# Patient Record
Sex: Male | Born: 1990 | Race: Black or African American | Hispanic: No | Marital: Single | Smoking: Current every day smoker
Health system: Southern US, Community
[De-identification: ages and names within clinical notes are randomized; demographics above are authoritative.]

## PROBLEM LIST (undated history)

## (undated) DIAGNOSIS — F32A Depression, unspecified: Secondary | ICD-10-CM

## (undated) DIAGNOSIS — F329 Major depressive disorder, single episode, unspecified: Secondary | ICD-10-CM

## (undated) DIAGNOSIS — M419 Scoliosis, unspecified: Secondary | ICD-10-CM

## (undated) DIAGNOSIS — F209 Schizophrenia, unspecified: Secondary | ICD-10-CM

## (undated) HISTORY — PX: BACK SURGERY: SHX140

---

## 2010-08-11 ENCOUNTER — Emergency Department (HOSPITAL_COMMUNITY): Admission: EM | Admit: 2010-08-11 | Discharge: 2010-08-11 | Payer: Self-pay | Admitting: Emergency Medicine

## 2014-07-18 ENCOUNTER — Emergency Department (HOSPITAL_COMMUNITY): Payer: Self-pay

## 2014-07-18 ENCOUNTER — Encounter (HOSPITAL_COMMUNITY): Payer: Self-pay | Admitting: Emergency Medicine

## 2014-07-18 ENCOUNTER — Inpatient Hospital Stay (HOSPITAL_COMMUNITY)
Admission: EM | Admit: 2014-07-18 | Discharge: 2014-07-23 | DRG: 918 | Disposition: A | Discharge status: Other Acute Inpatient Hospital | Payer: Self-pay | Attending: Family Medicine | Admitting: Family Medicine

## 2014-07-18 DIAGNOSIS — R9431 Abnormal electrocardiogram [ECG] [EKG]: Secondary | ICD-10-CM | POA: Diagnosis present

## 2014-07-18 DIAGNOSIS — G2579 Other drug induced movement disorders: Secondary | ICD-10-CM | POA: Diagnosis present

## 2014-07-18 DIAGNOSIS — T43022A Poisoning by tetracyclic antidepressants, intentional self-harm, initial encounter: Secondary | ICD-10-CM | POA: Diagnosis present

## 2014-07-18 DIAGNOSIS — H5500 Unspecified nystagmus: Secondary | ICD-10-CM | POA: Diagnosis present

## 2014-07-18 DIAGNOSIS — Z8659 Personal history of other mental and behavioral disorders: Secondary | ICD-10-CM

## 2014-07-18 DIAGNOSIS — Z681 Body mass index (BMI) 19 or less, adult: Secondary | ICD-10-CM

## 2014-07-18 DIAGNOSIS — Y92009 Unspecified place in unspecified non-institutional (private) residence as the place of occurrence of the external cause: Secondary | ICD-10-CM

## 2014-07-18 DIAGNOSIS — T43212A Poisoning by selective serotonin and norepinephrine reuptake inhibitors, intentional self-harm, initial encounter: Secondary | ICD-10-CM | POA: Diagnosis present

## 2014-07-18 DIAGNOSIS — R4182 Altered mental status, unspecified: Secondary | ICD-10-CM

## 2014-07-18 DIAGNOSIS — T443X1A Poisoning by other parasympatholytics [anticholinergics and antimuscarinics] and spasmolytics, accidental (unintentional), initial encounter: Secondary | ICD-10-CM | POA: Diagnosis present

## 2014-07-18 DIAGNOSIS — F32A Depression, unspecified: Secondary | ICD-10-CM | POA: Diagnosis present

## 2014-07-18 DIAGNOSIS — M6282 Rhabdomyolysis: Secondary | ICD-10-CM | POA: Diagnosis present

## 2014-07-18 DIAGNOSIS — R451 Restlessness and agitation: Secondary | ICD-10-CM | POA: Diagnosis present

## 2014-07-18 DIAGNOSIS — T50904A Poisoning by unspecified drugs, medicaments and biological substances, undetermined, initial encounter: Secondary | ICD-10-CM

## 2014-07-18 DIAGNOSIS — E162 Hypoglycemia, unspecified: Secondary | ICD-10-CM | POA: Diagnosis present

## 2014-07-18 DIAGNOSIS — D72829 Elevated white blood cell count, unspecified: Secondary | ICD-10-CM | POA: Diagnosis present

## 2014-07-18 DIAGNOSIS — M25519 Pain in unspecified shoulder: Secondary | ICD-10-CM

## 2014-07-18 DIAGNOSIS — T443X4A Poisoning by other parasympatholytics [anticholinergics and antimuscarinics] and spasmolytics, undetermined, initial encounter: Secondary | ICD-10-CM

## 2014-07-18 DIAGNOSIS — T443X2A Poisoning by other parasympatholytics [anticholinergics and antimuscarinics] and spasmolytics, intentional self-harm, initial encounter: Secondary | ICD-10-CM | POA: Diagnosis present

## 2014-07-18 DIAGNOSIS — T434X2A Poisoning by butyrophenone and thiothixene neuroleptics, intentional self-harm, initial encounter: Secondary | ICD-10-CM | POA: Diagnosis present

## 2014-07-18 DIAGNOSIS — F259 Schizoaffective disorder, unspecified: Secondary | ICD-10-CM | POA: Diagnosis present

## 2014-07-18 DIAGNOSIS — T43592A Poisoning by other antipsychotics and neuroleptics, intentional self-harm, initial encounter: Principal | ICD-10-CM | POA: Diagnosis present

## 2014-07-18 DIAGNOSIS — F329 Major depressive disorder, single episode, unspecified: Secondary | ICD-10-CM | POA: Diagnosis present

## 2014-07-18 DIAGNOSIS — T50901A Poisoning by unspecified drugs, medicaments and biological substances, accidental (unintentional), initial encounter: Secondary | ICD-10-CM | POA: Diagnosis present

## 2014-07-18 DIAGNOSIS — W19XXXA Unspecified fall, initial encounter: Secondary | ICD-10-CM

## 2014-07-18 DIAGNOSIS — F1290 Cannabis use, unspecified, uncomplicated: Secondary | ICD-10-CM | POA: Diagnosis present

## 2014-07-18 DIAGNOSIS — F17210 Nicotine dependence, cigarettes, uncomplicated: Secondary | ICD-10-CM | POA: Diagnosis present

## 2014-07-18 HISTORY — DX: Schizophrenia, unspecified: F20.9

## 2014-07-18 HISTORY — DX: Scoliosis, unspecified: M41.9

## 2014-07-18 HISTORY — DX: Depression, unspecified: F32.A

## 2014-07-18 HISTORY — DX: Major depressive disorder, single episode, unspecified: F32.9

## 2014-07-18 LAB — COMPREHENSIVE METABOLIC PANEL
ALT: 10 U/L (ref 0–53)
AST: 27 U/L (ref 0–37)
Albumin: 4.6 g/dL (ref 3.5–5.2)
Alkaline Phosphatase: 83 U/L (ref 39–117)
Anion gap: 14 (ref 5–15)
BUN: 12 mg/dL (ref 6–23)
CALCIUM: 9.3 mg/dL (ref 8.4–10.5)
CO2: 23 mEq/L (ref 19–32)
Chloride: 103 mEq/L (ref 96–112)
Creatinine, Ser: 0.94 mg/dL (ref 0.50–1.35)
GFR calc non Af Amer: >90 mL/min (ref >90)
GLUCOSE: 64 mg/dL — AB (ref 70–99)
Potassium: 4.8 mEq/L (ref 3.7–5.3)
SODIUM: 140 meq/L (ref 137–147)
TOTAL PROTEIN: 7.9 g/dL (ref 6.0–8.3)
Total Bilirubin: 0.5 mg/dL (ref 0.3–1.2)

## 2014-07-18 LAB — CBC WITH DIFFERENTIAL/PLATELET
BASOS ABS: 0 10*3/uL (ref 0.0–0.1)
BASOS PCT: 0 % (ref 0–1)
EOS ABS: 0 10*3/uL (ref 0.0–0.7)
EOS PCT: 0 % (ref 0–5)
HCT: 42.5 % (ref 39.0–52.0)
Hemoglobin: 15 g/dL (ref 13.0–17.0)
Lymphocytes Relative: 16 % (ref 12–46)
Lymphs Abs: 1.8 10*3/uL (ref 0.7–4.0)
MCH: 28.5 pg (ref 26.0–34.0)
MCHC: 35.3 g/dL (ref 30.0–36.0)
MCV: 80.6 fL (ref 78.0–100.0)
Monocytes Absolute: 0 10*3/uL — ABNORMAL LOW (ref 0.1–1.0)
Monocytes Relative: 0 % — ABNORMAL LOW (ref 3–12)
Neutro Abs: 9.7 10*3/uL — ABNORMAL HIGH (ref 1.7–7.7)
Neutrophils Relative %: 84 % — ABNORMAL HIGH (ref 43–77)
PLATELETS: 264 10*3/uL (ref 150–400)
RBC: 5.27 MIL/uL (ref 4.22–5.81)
RDW: 13.7 % (ref 11.5–15.5)
WBC: 11.6 10*3/uL — ABNORMAL HIGH (ref 4.0–10.5)

## 2014-07-18 LAB — MAGNESIUM: MAGNESIUM: 2.5 mg/dL (ref 1.5–2.5)

## 2014-07-18 LAB — BLOOD GAS, ARTERIAL
Acid-base deficit: 1.4 mmol/L (ref 0.0–2.0)
BICARBONATE: 22.9 meq/L (ref 20.0–24.0)
Drawn by: 257701
O2 Saturation: 97 %
PCO2 ART: 39.6 mmHg (ref 35.0–45.0)
PH ART: 7.381 (ref 7.350–7.450)
PO2 ART: 95.7 mmHg (ref 80.0–100.0)
Patient temperature: 98.6
TCO2: 20 mmol/L (ref 0–100)

## 2014-07-18 LAB — ETHANOL

## 2014-07-18 LAB — URINALYSIS, ROUTINE W REFLEX MICROSCOPIC
Bilirubin Urine: NEGATIVE
Glucose, UA: NEGATIVE mg/dL
KETONES UR: 15 mg/dL — AB
LEUKOCYTES UA: NEGATIVE
NITRITE: NEGATIVE
PH: 5 (ref 5.0–8.0)
Protein, ur: NEGATIVE mg/dL
Specific Gravity, Urine: 1.022 (ref 1.005–1.030)
Urobilinogen, UA: 0.2 mg/dL (ref 0.0–1.0)

## 2014-07-18 LAB — RAPID URINE DRUG SCREEN, HOSP PERFORMED
Amphetamines: NOT DETECTED
BENZODIAZEPINES: NOT DETECTED
Barbiturates: NOT DETECTED
Cocaine: NOT DETECTED
Opiates: NOT DETECTED
Tetrahydrocannabinol: POSITIVE — AB

## 2014-07-18 LAB — ACETAMINOPHEN LEVEL: Acetaminophen (Tylenol), Serum: <15 ug/mL (ref 10–30)

## 2014-07-18 LAB — CK: CK TOTAL: 1114 U/L — AB (ref 7–232)

## 2014-07-18 LAB — URINE MICROSCOPIC-ADD ON

## 2014-07-18 LAB — MRSA PCR SCREENING: MRSA by PCR: NEGATIVE

## 2014-07-18 LAB — SALICYLATE LEVEL

## 2014-07-18 LAB — CBG MONITORING, ED: GLUCOSE-CAPILLARY: 121 mg/dL — AB (ref 70–99)

## 2014-07-18 MED ORDER — SODIUM CHLORIDE 0.9 % IV SOLN: INTRAVENOUS | Status: DC

## 2014-07-18 MED ORDER — SODIUM CHLORIDE 0.9 % IJ SOLN
3.0000 mL | Freq: Two times a day (BID) | INTRAMUSCULAR | Status: DC
  Administered 2014-07-19 – 2014-07-23 (×6): 3 mL via INTRAVENOUS

## 2014-07-18 MED ORDER — SODIUM CHLORIDE 0.9 % IV BOLUS (SEPSIS)
1000.0000 mL | Freq: Once | INTRAVENOUS | Status: AC
  Administered 2014-07-18: 1000 mL via INTRAVENOUS

## 2014-07-18 MED ORDER — LORAZEPAM 2 MG/ML IJ SOLN
1.0000 mg | INTRAMUSCULAR | Status: DC | PRN
  Administered 2014-07-19 – 2014-07-20 (×5): 1 mg via INTRAVENOUS
  Filled 2014-07-18 (×5): qty 1

## 2014-07-18 MED ORDER — LORAZEPAM 2 MG/ML IJ SOLN
0.5000 mg | Freq: Once | INTRAMUSCULAR | Status: AC
  Administered 2014-07-18: 0.5 mg via INTRAVENOUS
  Filled 2014-07-18: qty 1

## 2014-07-18 MED ORDER — DEXTROSE-NACL 5-0.45 % IV SOLN
INTRAVENOUS | Status: DC
  Administered 2014-07-18: 19:00:00 via INTRAVENOUS
  Administered 2014-07-19: 125 mL/h via INTRAVENOUS
  Administered 2014-07-19 – 2014-07-22 (×5): via INTRAVENOUS

## 2014-07-18 MED ORDER — ENOXAPARIN SODIUM 40 MG/0.4ML ~~LOC~~ SOLN
40.0000 mg | SUBCUTANEOUS | Status: DC
Start: 2014-07-18 — End: 2014-07-23
  Administered 2014-07-18 – 2014-07-22 (×5): 40 mg via SUBCUTANEOUS
  Filled 2014-07-18 (×6): qty 0.4

## 2014-07-18 MED ORDER — DEXTROSE-NACL 5-0.9 % IV SOLN
INTRAVENOUS | Status: DC
  Administered 2014-07-18: 17:00:00 via INTRAVENOUS

## 2014-07-18 MED ORDER — DEXTROSE 50 % IV SOLN
1.0000 | Freq: Once | INTRAVENOUS | Status: AC
  Administered 2014-07-18: 50 mL via INTRAVENOUS
  Filled 2014-07-18: qty 50

## 2014-07-18 NOTE — ED Notes (Signed)
Pt found face down at home at 1300 by fiancee who has not seen or spoke to pt since 1230am. Pt was found was blister packs of meds around him. Benztropine, Effexor, Haldol, Seroquel, Remeron. Fiancee called ems because pt was only staring and not answering questions appropriately. Pt follows commands but does not answer all questions. Only shakes or nods head, or mumbles. Pt reaching out for things, possibly hallucinating. Pupils dilated, 9mm. EMS IV 18g L AC, BP 150/90, 117, 98% RA, cbg 93.

## 2014-07-18 NOTE — ED Notes (Signed)
Poison Control called due to ? Ingestion of multiple drugs. Spoke with ConcordiaBernice. Due to Serquel, Efferox, and Haldol monitor for change in mental changes, Prolonged QT (Treat with Mag) Prolonged QRS (treat Na Bicarb) Observe for seizures, Vent arryth and bradycardic Seziures tx with Benzo's.  Obtain Tylenol level, Liver Function, CPK, Mag and hydrate pt as well as obtain  EKG.

## 2014-07-18 NOTE — H&P (Signed)
History and Physical    Patrick Wong JXB:[id number] DOB: [birth year] DOA: 07/18/2014  Referring physician: Dr. Lynelle DoctorKnapp PCP: No primary provider on file.  Specialists: none   Chief Complaint: overdose  HPI: Patrick LarkLaquan L Revere is a 23 y.o. male has a past medical history significant for depression, schizophrenia not currently on any medication is being brought by his girlfriend after being found down. Patient was last seen at midnight the night prior to presentation when he went to his girlfriend's house where he was supposed to stay and mow the lawn in the morning. Next morning he did not answer any phone calls and family got concerned and when his girlfriend arrived he was laying face down in the yard. He was conscious but had difficulties talking. His girlfriend found few days prior, several prescription medications booklets (arranged Monday - Sunday) prescribed for a different person (she tells me that she found these medications "lying down in the street" and she brought them to her house for unclear reasons). There are ~6 booklets that were full with Benztropine 1 mg BID, Venlafaxine ER 150 mg BID, Haldol 10 mg QHS, Seroquel 200 mg QHS, Mirtazapine 15, 1/2 tablet QHS and Haldol 2 mg QHS prescribed for someone named [name] (per booklet notes). It appears that these booklets were full initially and now all medications (6 week supply) are missing. Patient is known to have depression but has been acting relatively normal. Few days prior to today at one point he appeared more withdrawn and was asking his girlfriend if she also is hearing "these voices". No suicidal ideation expressed to any family members and no apparent recent stressors. In the ED patient alert, confused, non verbal. ABG was within normal limits, labs show mild leukocytosis at 11.6 and elevated CK to 1114. EKG shows sinus rhythm with a QTc of ~470. Per his family he does not drink ETOH regularly, uses THC but no other drugs.    Review of Systems: unable to obtain ROS due to AMS  Past Medical History  Diagnosis Date  . Scoliosis   . Depression   . Schizophrenia    Past Surgical History  Procedure Laterality Date  . Back surgery     Social History:  reports that he has been smoking.  He does not have any smokeless tobacco history on file. He reports that he drinks alcohol. He reports that he uses illicit drugs (Marijuana).  No Known Allergies  Family history non contributory.   Prior to Admission medications   Not on File   Physical Exam: Filed Vitals:   07/18/14 1433  BP: 144/91  Pulse: 109  Temp: 97.6 F (36.4 C)  TempSrc: Oral  Resp: 20  SpO2: 100%     General:  Confused, alert, appears to have hallucinations  Eyes: pupils mydriatic, 7 cm, minimally reactive, nystagmus  ENT: dry oropharynx  Neck: supple, no JVD  Cardiovascular: regular rate without MRG; 2+ peripheral pulses  Respiratory: CTA biL, no wheezing on anterior auscultation  Abdomen: soft  Skin: no rashes  Musculoskeletal: no peripheral edema  Neurologic: moves all, increased reflexes   Labs on Admission:  Basic Metabolic Panel:  Recent Labs Lab 07/18/14 1458 07/18/14 1514  NA 140  --   K 4.8  --   CL 103  --   CO2 23  --   GLUCOSE 64*  --   BUN 12  --   CREATININE 0.94  --   CALCIUM 9.3  --   MG  --  2.5   Liver Function Tests:  Recent Labs Lab 07/18/14 1458  AST 27  ALT 10  ALKPHOS 83  BILITOT 0.5  PROT 7.9  ALBUMIN 4.6   CBC:  Recent Labs Lab 07/18/14 1458  WBC 11.6*  NEUTROABS 9.7*  HGB 15.0  HCT 42.5  MCV 80.6  PLT 264   Cardiac Enzymes:  Recent Labs Lab 07/18/14 1514  CKTOTAL 1114*   Radiological Exams on Admission: Dg Chest Portable 1 View  07/18/2014   CLINICAL DATA:  Drug overdose. History of scoliosis. Depression, schizophrenia.  EXAM: PORTABLE CHEST - 1 VIEW  COMPARISON:  Thoracic spine series 08/11/2010.  FINDINGS: Posterior fusion from T5-L1. Findings stable  since prior thoracic spine series. Lungs are clear. Heart is normal size. No effusions or acute bony abnormality.  IMPRESSION: No active cardiopulmonary disease.   Electronically Signed   By: Charlett NoseKevin  Dover M.D.   On: 07/18/2014 15:23   EKG: Independently reviewed. Sinus rhytm  Assessment/Plan Active Problems:   Overdose   Depression   Prolonged QT interval   Leukocytosis   Rhabdomyolysis   Serotonin syndrome   Anticholinergic syndrome   History of schizophrenia   #1 Overdose with 5 different agents - patient brought by family with significant overdose, ingesting apparently 6 weeks worth of medications as above within a window of ~12 hours. EDP discussed with poison control and recommended close monitoring in SDU, EKG monitoring, and seizure precautions.  - For now he appears to protect his airway and his ABG is reassuring.  - He has components of anticholinergic syndrome with minimally reactive mydriasis, dry skin and hallucinations. Monitor UOP. - He has components of serotonin syndrome with hyperreflexia and nystagmus - seizure precautions - neuro checks Q4 - Ativan PRN for agitation/seizures  #2 Hypoglycemia - D5 1/2 NS   #3 Depression - will consult psych when more alert - suicidal intent not clear at this point - ask for sitter  #4 History of schizophrenia - will consult psych when more alert  #5 Rhabdomyolysis - monitor CK - IVF - monitor renal function   Diet: NPO Fluids: D5 1/2 NS DVT Prophylaxis: Lovenox  Code Status: Full  Family Communication: d/w father and his girlfriend bedside  Disposition Plan: admit to SDU  Time spent: 5670  Marcile Fuquay M. Elvera LennoxGherghe, MD [facility] Pager [phone]  If 7PM-7AM, please contact night-coverage [email or web address] Password [email or web address] 07/18/2014, 5:12 PM

## 2014-07-18 NOTE — ED Notes (Signed)
Bed: RESB Expected date: 07/18/14 Expected time: 2:04 PM Means of arrival: Ambulance Comments: ? Drug ingestion

## 2014-07-18 NOTE — ED Provider Notes (Addendum)
CSN: [id number]     Arrival date & time 07/18/14  1432 History   First MD Initiated Contact with Patient 07/18/14 1434     Chief Complaint  Patient presents with  . Drug Overdose     (Consider location/radiation/quality/duration/timing/severity/associated sxs/prior Treatment) HPI Girlfriend reports patient has a history of depression however he is not taking any medications. She states she's noticed over the past couple weeks he's gotten detached from the family and is not as interactive. She states the last time she saw him was at midnight last night. She has been calling him this morning and he has not responded. She states he was supposed to meet his father today to mow  grass at a empty house that they own. She states she stop by there on her way to work and found the patient lying facedown in the yard. She states he was awake but he was not responding verbally to her except to indicate he did not want EMS to be called. She reports she found about 6 or 7 medication packs but are not the patient's that had been full when she saw them yesterday and they are now emptied. Each pack has a one-week supply of Seroquel, Cogentin, Haldol, Remeron and Effexor. So he may had ingested about 6-7 weeks pills.  PCP none  Past Medical History  Diagnosis Date  . Scoliosis   . Depression   . Schizophrenia      Past Surgical History  Procedure Laterality Date  . Back surgery     No family history on file. History  Substance Use Topics  . Smoking status: Current Every Day Smoker  . Smokeless tobacco: Not on file  . Alcohol Use: Yes     Comment: 2 beers 1-2x/week  +THC unemployed Going to [employer] to learn a trade  Review of Systems  All other systems reviewed and are negative.     Allergies  Review of patient's allergies indicates no known allergies.  Home Medications   Prior to Admission medications   Not on File   BP 144/91  Pulse 109  Temp(Src) 97.6 F (36.4 C) (Oral)   Resp 20  SpO2 100%  Vital signs normal except tachycardia  Physical Exam  Nursing note and vitals reviewed. Constitutional: He is oriented to person, place, and time. He appears well-developed and well-nourished.  Non-toxic appearance. He does not appear ill. He appears distressed.  Patient is staring as if he is seeing something that I cannot see  HENT:  Head: Normocephalic and atraumatic.  Right Ear: External ear normal.  Left Ear: External ear normal.  Nose: Nose normal. No mucosal edema or rhinorrhea.  Mouth/Throat: Oropharynx is clear and moist and mucous membranes are normal. No dental abscesses or uvula swelling.  Eyes: Conjunctivae and EOM are normal. Pupils are equal, round, and reactive to light.  Dilated pupils  Neck: Normal range of motion and full passive range of motion without pain. Neck supple.  Cardiovascular: Normal rate, regular rhythm and normal heart sounds.  Exam reveals no gallop and no friction rub.   No murmur heard. Pulmonary/Chest: Effort normal and breath sounds normal. No respiratory distress. He has no wheezes. He has no rhonchi. He has no rales. He exhibits no tenderness and no crepitus.  Abdominal: Soft. Normal appearance and bowel sounds are normal. He exhibits no distension. There is no tenderness. There is no rebound and no guarding.  Musculoskeletal: Normal range of motion. He exhibits no edema and no tenderness.  Moves all extremities well.   Neurological: He is alert and oriented to person, place, and time. He has normal strength. No cranial nerve deficit.  Patient can follow some simple commands such as opening her mouth, patient has some rigidity.  Skin: Skin is warm, dry and intact. No rash noted. No erythema. No pallor.  Psychiatric: His mood appears anxious. He is agitated.  Patient is not responding verbally    ED Course  Procedures (including critical care time) Medications  0.9 %  sodium chloride infusion (not administered)  LORazepam  (ATIVAN) injection 0.5 mg (not administered)  dextrose 5 %-0.9 % sodium chloride infusion (not administered)  dextrose 50 % solution 50 mL (not administered)  sodium chloride 0.9 % bolus 1,000 mL (1,000 mLs Intravenous New Bag/Given 07/18/14 1556)    Patient was placed on a monitor. He had 2 IVs started. Poison control was contacted by nursing staff. They state patient is at risk to have prolonged QT interval which should be treated with magnesium, he also could have prolonged QRS which is treated with bicarbonate, he is at risk for seizure which should be treated with benzodiazepines. He also is at risk to have ventricular arrhythmias or bradycardia.  15:00 Patient's QRS on his EKG is 474 ms, when it approaches 500 ms IV magnesium will be started.  Recheck at 1550 patient continues to stare. He is attempting to speak but his speech is unintelligible. Patient noted having some mild tremor of his left lower extremity. He was given Ativan 0.5 mg IV. Review is as of his labs show he has some mild hypoglycemia, patient is unable to eat or drink. He was given one amp of D50 and started on a D5 normal saline continuous IV infusion. Repeat EKG shows no change specifically his QTC interval is prolonged but still less than 500 ms.  16:05 D/W poison control again, feels he needs to be admitted b/o the amount of meds he took and he is mainly having anticholinergic symptoms which can be prolonged.   16:33 Dr Lafe Garin, admit to step down  16:50 Third EKG shows QTIc is starting to increase. Still under 500 ms.   Labs Review Results for orders placed during the hospital encounter of 07/18/14  CBC WITH DIFFERENTIAL      Result Value Ref Range   WBC 11.6 (*) 4.0 - 10.5 K/uL   RBC 5.27  4.22 - 5.81 MIL/uL   Hemoglobin 15.0  13.0 - 17.0 g/dL   HCT 40.9  81.1 - 91.4 %   MCV 80.6  78.0 - 100.0 fL   MCH 28.5  26.0 - 34.0 pg   MCHC 35.3  30.0 - 36.0 g/dL   RDW 78.2  95.6 - 21.3 %   Platelets 264  150 - 400 K/uL    Neutrophils Relative % 84 (*) 43 - 77 %   Neutro Abs 9.7 (*) 1.7 - 7.7 K/uL   Lymphocytes Relative 16  12 - 46 %   Lymphs Abs 1.8  0.7 - 4.0 K/uL   Monocytes Relative 0 (*) 3 - 12 %   Monocytes Absolute 0.0 (*) 0.1 - 1.0 K/uL   Eosinophils Relative 0  0 - 5 %   Eosinophils Absolute 0.0  0.0 - 0.7 K/uL   Basophils Relative 0  0 - 1 %   Basophils Absolute 0.0  0.0 - 0.1 K/uL  COMPREHENSIVE METABOLIC PANEL      Result Value Ref Range   Sodium 140  137 - 147  mEq/L   Potassium 4.8  3.7 - 5.3 mEq/L   Chloride 103  96 - 112 mEq/L   CO2 23  19 - 32 mEq/L   Glucose, Bld 64 (*) 70 - 99 mg/dL   BUN 12  6 - 23 mg/dL   Creatinine, Ser 1.61  0.50 - 1.35 mg/dL   Calcium 9.3  8.4 - 09.6 mg/dL   Total Protein 7.9  6.0 - 8.3 g/dL   Albumin 4.6  3.5 - 5.2 g/dL   AST 27  0 - 37 U/L   ALT 10  0 - 53 U/L   Alkaline Phosphatase 83  39 - 117 U/L   Total Bilirubin 0.5  0.3 - 1.2 mg/dL   GFR calc non Af Amer >90  >90 mL/min   GFR calc Af Amer >90  >90 mL/min   Anion gap 14  5 - 15  ACETAMINOPHEN LEVEL      Result Value Ref Range   Acetaminophen (Tylenol), Serum <15.0  10 - 30 ug/mL  SALICYLATE LEVEL      Result Value Ref Range   Salicylate Lvl <2.0 (*) 2.8 - 20.0 mg/dL  URINE RAPID DRUG SCREEN (HOSP PERFORMED)      Result Value Ref Range   Opiates NONE DETECTED  NONE DETECTED   Cocaine NONE DETECTED  NONE DETECTED   Benzodiazepines NONE DETECTED  NONE DETECTED   Amphetamines NONE DETECTED  NONE DETECTED   Tetrahydrocannabinol POSITIVE (*) NONE DETECTED   Barbiturates NONE DETECTED  NONE DETECTED  ETHANOL      Result Value Ref Range   Alcohol, Ethyl (B) <11  0 - 11 mg/dL  BLOOD GAS, ARTERIAL      Result Value Ref Range   pH, Arterial 7.381  7.350 - 7.450   pCO2 arterial 39.6  35.0 - 45.0 mmHg   pO2, Arterial 95.7  80.0 - 100.0 mmHg   Bicarbonate 22.9  20.0 - 24.0 mEq/L   TCO2 20.0  0 - 100 mmol/L   Acid-base deficit 1.4  0.0 - 2.0 mmol/L   O2 Saturation 97.0     Patient temperature  98.6     Collection site RIGHT RADIAL     Drawn by [id number]     Sample type ARTERIAL DRAW     Allens test (pass/fail) PASS  PASS  CK      Result Value Ref Range   Total CK 1114 (*) 7 - 232 U/L  MAGNESIUM      Result Value Ref Range   Magnesium 2.5  1.5 - 2.5 mg/dL    Laboratory interpretation all normal mildly elevated CK, leukocytosis, hypoglylcemia   Imaging Review Dg Chest Portable 1 View  07/18/2014   CLINICAL DATA:  Drug overdose. History of scoliosis. Depression, schizophrenia.  EXAM: PORTABLE CHEST - 1 VIEW  COMPARISON:  Thoracic spine series 08/11/2010.  FINDINGS: Posterior fusion from T5-L1. Findings stable since prior thoracic spine series. Lungs are clear. Heart is normal size. No effusions or acute bony abnormality.  IMPRESSION: No active cardiopulmonary disease.   Electronically Signed   By: Charlett Nose M.D.   On: 07/18/2014 15:23     EKG Interpretation   Date/Time:  Sunday July 18 2014 14:38:53 EDT Ventricular Rate:  109 PR Interval:  155 QRS Duration: 93 QT Interval:  352 QTC Calculation: 474 R Axis:   84 Text Interpretation:  Sinus tachycardia Probable left atrial enlargement  Probable left ventricular hypertrophy ST elev, probable normal early repol  pattern Borderline prolonged  QT interval Baseline wander in lead(s) V4 No  old tracing to compare Confirmed by Brand Siever  MD-I, Miette Molenda ([id number]) on 07/18/2014  3:03:33 PM    #2  EKG Interpretation  Date/Time:  Sunday July 18 2014 15:54:02 EDT Ventricular Rate:  110 PR Interval:  151 QRS Duration: 91 QT Interval:  351 QTC Calculation: 475 R Axis:   82 Text Interpretation:  Sinus tachycardia Biatrial enlargement Probable left ventricular hypertrophy Borderline prolonged QT interval No significant change since last tracing earlier today Confirmed by Kiylah Loyer  MD-I, Ashaad Gaertner ([id number]) on 07/18/2014 3:58:05 PM      #3  EKG Interpretation  Date/Time:  Sunday July 18 2014 16:48:26 EDT Ventricular Rate:  109 PR  Interval:  161 QRS Duration: 95 QT Interval:  367 QTC Calculation: 494 R Axis:   87 Text Interpretation:  Sinus tachycardia Biatrial enlargement Left ventricular hypertrophy Prolonged QT interval Since last tracing of earlier today QT has lengthened Confirmed by Lamichael Youkhana  MD-I, Adeeb Konecny ([id number]) on 07/18/2014 4:53:44 PM          MDM   Final diagnoses:  Overdose, undetermined intent, initial encounter  Hypoglycemia  Non-traumatic rhabdomyolysis  Altered mental status, unspecified altered mental status type    Disposition  admission   Devoria AlbeIva Tenelle Andreason, MD, FACEP  CRITICAL CARE Performed by: Devoria AlbeKNAPP,Nehemie Casserly L Total critical care time: 40 min Critical care time was exclusive of separately billable procedures and treating other patients. Critical care was necessary to treat or prevent imminent or life-threatening deterioration. Critical care was time spent personally by me on the following activities: development of treatment plan with patient and/or surrogate as well as nursing, discussions with consultants, evaluation of patient's response to treatment, examination of patient, obtaining history from patient or surrogate, ordering and performing treatments and interventions, ordering and review of laboratory studies, ordering and review of radiographic studies, pulse oximetry and re-evaluation of patient's condition.    Ward GivensIva L Nazair Fortenberry, MD 07/18/14 1640  Ward GivensIva L Trayson Stitely, MD 07/18/14 [phone]

## 2014-07-19 ENCOUNTER — Inpatient Hospital Stay (HOSPITAL_COMMUNITY): Payer: Self-pay

## 2014-07-19 DIAGNOSIS — T50904A Poisoning by unspecified drugs, medicaments and biological substances, undetermined, initial encounter: Secondary | ICD-10-CM

## 2014-07-19 LAB — CBC WITH DIFFERENTIAL/PLATELET
Basophils Absolute: 0 10*3/uL (ref 0.0–0.1)
Basophils Relative: 0 % (ref 0–1)
EOS ABS: 0.1 10*3/uL (ref 0.0–0.7)
Eosinophils Relative: 1 % (ref 0–5)
HCT: 40.1 % (ref 39.0–52.0)
Hemoglobin: 13.6 g/dL (ref 13.0–17.0)
LYMPHS PCT: 13 % (ref 12–46)
Lymphs Abs: 1.4 10*3/uL (ref 0.7–4.0)
MCH: 27.8 pg (ref 26.0–34.0)
MCHC: 33.9 g/dL (ref 30.0–36.0)
MCV: 81.8 fL (ref 78.0–100.0)
Monocytes Absolute: 1.2 10*3/uL — ABNORMAL HIGH (ref 0.1–1.0)
Monocytes Relative: 11 % (ref 3–12)
Neutro Abs: 8.3 10*3/uL — ABNORMAL HIGH (ref 1.7–7.7)
Neutrophils Relative %: 75 % (ref 43–77)
PLATELETS: 226 10*3/uL (ref 150–400)
RBC: 4.9 MIL/uL (ref 4.22–5.81)
RDW: 13.6 % (ref 11.5–15.5)
WBC: 11.1 10*3/uL — AB (ref 4.0–10.5)

## 2014-07-19 LAB — CK
CK TOTAL: 4846 U/L — AB (ref 7–232)
CK TOTAL: 6336 U/L — AB (ref 7–232)

## 2014-07-19 LAB — MAGNESIUM: MAGNESIUM: 2.1 mg/dL (ref 1.5–2.5)

## 2014-07-19 LAB — COMPREHENSIVE METABOLIC PANEL
ALT: 14 U/L (ref 0–53)
AST: 70 U/L — ABNORMAL HIGH (ref 0–37)
Albumin: 4 g/dL (ref 3.5–5.2)
Alkaline Phosphatase: 78 U/L (ref 39–117)
Anion gap: 12 (ref 5–15)
BUN: 8 mg/dL (ref 6–23)
CO2: 23 mEq/L (ref 19–32)
Calcium: 9 mg/dL (ref 8.4–10.5)
Chloride: 101 mEq/L (ref 96–112)
Creatinine, Ser: 0.92 mg/dL (ref 0.50–1.35)
GFR calc non Af Amer: >90 mL/min (ref >90)
GLUCOSE: 94 mg/dL (ref 70–99)
POTASSIUM: 4.1 meq/L (ref 3.7–5.3)
Sodium: 136 mEq/L — ABNORMAL LOW (ref 137–147)
TOTAL PROTEIN: 7.1 g/dL (ref 6.0–8.3)
Total Bilirubin: 1.1 mg/dL (ref 0.3–1.2)

## 2014-07-19 LAB — PROTIME-INR
INR: 1.12 (ref 0.00–1.49)
Prothrombin Time: 14.5 seconds (ref 11.6–15.2)

## 2014-07-19 LAB — GLUCOSE, CAPILLARY: Glucose-Capillary: 99 mg/dL (ref 70–99)

## 2014-07-19 LAB — PHOSPHORUS: PHOSPHORUS: 3.4 mg/dL (ref 2.3–4.6)

## 2014-07-19 MED ORDER — CETYLPYRIDINIUM CHLORIDE 0.05 % MT LIQD
7.0000 mL | Freq: Two times a day (BID) | OROMUCOSAL | Status: DC
  Administered 2014-07-19 – 2014-07-23 (×4): 7 mL via OROMUCOSAL

## 2014-07-19 MED ORDER — LORAZEPAM 2 MG/ML IJ SOLN
0.5000 mg | Freq: Once | INTRAMUSCULAR | Status: AC
  Administered 2014-07-19: 0.5 mg via INTRAVENOUS
  Filled 2014-07-19: qty 1

## 2014-07-19 MED ORDER — CHLORHEXIDINE GLUCONATE 0.12 % MT SOLN
15.0000 mL | Freq: Two times a day (BID) | OROMUCOSAL | Status: DC
  Administered 2014-07-20 – 2014-07-23 (×4): 15 mL via OROMUCOSAL
  Filled 2014-07-19 (×9): qty 15

## 2014-07-19 NOTE — Care Management Note (Addendum)
    Page 1 of 2   07/23/2014     4:03:36 PM CARE MANAGEMENT NOTE 07/23/2014  Patient:  Patrick Wong,Patrick Wong   Account Number:  [id number]  Date Initiated:  07/19/2014  Documentation initiated by:  DAVIS,RHONDA  Subjective/Objective Assessment:   Overdose with 5 different agents - patient brought by family with significant overdose, ingesting apparently 6 weeks worth of medications as above within a window of  12 hours.     Action/Plan:   home when stable and cleared by psych   Anticipated DC Date:  07/23/2014   Anticipated DC Plan:  PSYCHIATRIC HOSPITAL  In-house referral  Clinical Social Worker      DC Planning Services  CM consult      [facility]   Choice offered to / List presented to:  NA   DME arranged  NA      DME agency  NA     HH arranged  NA      HH agency  NA   Status of service:  Completed, signed off Medicare Important Message given?  YES (If response is "NO", the following Medicare IM given date fields will be blank) Date Medicare IM given:  07/21/2014 Medicare IM given by:  [facility] LLC[facility]HABIR,Marilla Boddy Date Additional Medicare IM given:   Additional Medicare IM given by:    Discharge Disposition:  PSYCHIATRIC HOSPITAL  Per UR Regulation:  Reviewed for med. necessity/level of care/duration of stay  If discussed at Long Length of Stay Meetings, dates discussed:    Comments:  07/23/14 Dovber Ernest RN,BSN NCM [id number] D/C [facility].  07/22/14 Oluwaseun Cremer RN,BSN NCM [phone] 1:1,CLEARING.RD FOLLOWING.PSYCH FOLLOWING.  07/21/14 Kalena Mander RN,BSN NCM [phone] 1:1, PSYCH FOLLOWING-AWAIT RECOMMENDATIONS.  [id number]/Rhonda Earlene Plateravis, RN, BSN, CCM Chart reviewed. Discharge needs and patient's stay to be reviewed and followed by case manager.

## 2014-07-19 NOTE — Progress Notes (Signed)
INITIAL NUTRITION ASSESSMENT  DOCUMENTATION CODES Per approved criteria  -Underweight   INTERVENTION: Diet advancement per MD RD to follow  NUTRITION DIAGNOSIS: Inadequate oral intake related to decreased mental status and NPO as evidenced by observation.   Goal: Diet advancement with intake of meals and supplements to meet >90% estimated needs.  Monitor:  Intake, labs, weight change  Reason for Assessment: MST  23 y.o. male  Admitting Dx: <principal problem not specified>  ASSESSMENT: Patient with a hx of depression and schizophrenia admitted after overdosing on 6 weeks of medications.    Currently patient is not alert enough at this time for oral intake.  Height: Ht Readings from Last 1 Encounters:  07/18/14 6\' 1"  (1.854 m)    Weight: Wt Readings from Last 1 Encounters:  07/19/14 136 lb 3.9 oz (61.8 kg)    Ideal Body Weight: 184 lbs  % Ideal Body Weight: 74  Wt Readings from Last 10 Encounters:  07/19/14 136 lb 3.9 oz (61.8 kg)    Usual Body Weight: unknown  % Usual Body Weight: unknown  BMI:  Body mass index is 17.98 kg/(m^2).  Estimated Nutritional Needs: Kcal: 1850-2050 Protein: 80-90 gm Fluid: >1.9L daily  Skin: intact  Diet Order: NPO  EDUCATION NEEDS: -No education needs identified at this time   Intake/Output Summary (Last 24 hours) at 07/19/14 1216 Last data filed at 07/19/14 0800  Gross per 24 hour  Intake   2125 ml  Output   1145 ml  Net    980 ml     Labs:   Recent Labs Lab 07/18/14 1458 07/18/14 1514 07/19/14 0335  NA 140  --  136*  K 4.8  --  4.1  CL 103  --  101  CO2 23  --  23  BUN 12  --  8  CREATININE 0.94  --  0.92  CALCIUM 9.3  --  9.0  MG  --  2.5 2.1  PHOS  --   --  3.4  GLUCOSE 64*  --  94    CBG (last 3)   Recent Labs  07/18/14 1740 07/19/14 0149  GLUCAP 121* 99    Scheduled Meds: . enoxaparin (LOVENOX) injection  40 mg Subcutaneous Q24H  . sodium chloride  3 mL Intravenous Q12H     Continuous Infusions: . dextrose 5 % and 0.45% NaCl 125 mL/hr (07/19/14 1144)    Past Medical History  Diagnosis Date  . Scoliosis   . Depression   . Schizophrenia     Past Surgical History  Procedure Laterality Date  . Back surgery      Oran ReinLaura Armani Brar, RD, LDN Clinical Inpatient Dietitian Pager:  [phone] Weekend and after hours pager:  [phone]

## 2014-07-19 NOTE — Progress Notes (Signed)
PROGRESS NOTE  Patrick Wong WUJ:[id number] DOB: 1991-04-06 DOA: 07/18/2014 PCP: No primary provider on file.  HPI: 23 y.o. male has a past medical history significant for depression, schizophrenia not currently on any medication is being brought by his girlfriend after being found down. He apparently took 6 weeks worth of Benztropine 1 mg BID, Venlafaxine ER 150 mg BID, Haldol 10 mg QHS, Seroquel 200 mg QHS, Mirtazapine 15, 1/2 tablet QHS and Haldol 2 mg QHS prescribed for someone named Patrick CollieMona Wong (per booklet notes).  Subjective/ 24 H Interval events - continues to be somewhat confused this morning, can tell me his name, age - mildly agitated   Assessment/Plan: Overdose with 5 different agents  - continue supportive care, he is doing better this morning - continue to monitor in SDU - mydriasis better this morning, still with hyperreflexia and clonus - once mental status better needs psych consult. - seizure precautions  - neuro checks - Ativan PRN for agitation/seizures  Hypoglycemia - D5 1/2 NS  Depression - will consult psych when more alert  - suicidal intent not clear at this point  - ask for sitter  History of schizophrenia - will consult psych when more alert  Rhabdomyolysis - monitor CK, increasing still this morning, due to immobilization and found down and drug induced agitation - IVF   Diet: NPO Fluids: D5 1/2 NS at 125 cc/h DVT Prophylaxis: Lovenox  Code Status: Full Family Communication: d/w girlfriend bedside  Disposition Plan: inpatient   Consultants:  None   Procedures:  None    Antibiotics None   Studies  Filed Vitals:   07/18/14 2300 07/19/14 0000 07/19/14 0355 07/19/14 0745  BP: 134/87  141/80 106/67  Pulse: 96  95 94  Temp:  97.6 F (36.4 C) 97.7 F (36.5 C) 98 F (36.7 C)  TempSrc:  Oral Oral Oral  Resp: 19  16 16   Height:      Weight:    61.8 kg (136 lb 3.9 oz)  SpO2: 97%  94% 100%    Intake/Output Summary (Last 24 hours)  at 07/19/14 0752 Last data filed at 07/19/14 0300  Gross per 24 hour  Intake   1500 ml  Output    855 ml  Net    645 ml   Filed Weights   07/18/14 1800 07/19/14 0745  Weight: 62.3 kg (137 lb 5.6 oz) 61.8 kg (136 lb 3.9 oz)    Exam:  General:  No apparent distress, mildly agitated  Cardiovascular: RRR  Respiratory: clear on anterior auscultation  Abdomen: soft  MSK: no edema  Neuro: moves all 4, follows commands, hyperreflexic, clonus present  Data Reviewed: Basic Metabolic Panel:  Recent Labs Lab 07/18/14 1458 07/18/14 1514 07/19/14 0335  NA 140  --  136*  K 4.8  --  4.1  CL 103  --  101  CO2 23  --  23  GLUCOSE 64*  --  94  BUN 12  --  8  CREATININE 0.94  --  0.92  CALCIUM 9.3  --  9.0  MG  --  2.5 2.1  PHOS  --   --  3.4   Liver Function Tests:  Recent Labs Lab 07/18/14 1458 07/19/14 0335  AST 27 70*  ALT 10 14  ALKPHOS 83 78  BILITOT 0.5 1.1  PROT 7.9 7.1  ALBUMIN 4.6 4.0   CBC:  Recent Labs Lab 07/18/14 1458 07/19/14 0335  WBC 11.6* 11.1*  NEUTROABS 9.7* 8.3*  HGB  15.0 13.6  HCT 42.5 40.1  MCV 80.6 81.8  PLT 264 226   Cardiac Enzymes:  Recent Labs Lab 07/18/14 1514 07/19/14 0335  CKTOTAL 1114* 4846*   CBG:  Recent Labs Lab 07/18/14 1740 07/19/14 0149  GLUCAP 121* 99    Recent Results (from the past 240 hour(s))  MRSA PCR SCREENING     Status: None   Collection Time    07/18/14  5:54 PM      Result Value Ref Range Status   MRSA by PCR NEGATIVE  NEGATIVE Final   Comment:            The GeneXpert MRSA Assay (FDA     approved for NASAL specimens     only), is one component of a     comprehensive MRSA colonization     surveillance program. It is not     intended to diagnose MRSA     infection nor to guide or     monitor treatment for     MRSA infections.     Studies: Dg Chest Portable 1 View 07/18/2014 Posterior fusion from T5-L1. Findings stable since prior thoracic spine series. Lungs are clear. Heart is  normal size. No effusions or acute bony abnormality. CT head 10/12 Technically challenging examination without acute abnormality  Scheduled Meds: . enoxaparin (LOVENOX) injection  40 mg Subcutaneous Q24H  . sodium chloride  3 mL Intravenous Q12H   Continuous Infusions: . dextrose 5 % and 0.45% NaCl 125 mL/hr at 07/18/14 1915    Active Problems:   Overdose   Depression   Prolonged QT interval   Leukocytosis   Rhabdomyolysis   Serotonin syndrome   Anticholinergic syndrome   History of schizophrenia   Time spent: 6735  Pamella Pertostin Baird Polinski, MD [facility] Pager [phone]. If 7 PM - 7 AM, please contact night-coverage at [email or web address], password [email or web address] 07/19/2014, 7:52 AM  LOS: 1 day

## 2014-07-20 ENCOUNTER — Inpatient Hospital Stay (HOSPITAL_COMMUNITY): Payer: Self-pay

## 2014-07-20 ENCOUNTER — Inpatient Hospital Stay (HOSPITAL_COMMUNITY): Payer: MEDICAID

## 2014-07-20 DIAGNOSIS — F259 Schizoaffective disorder, unspecified: Secondary | ICD-10-CM

## 2014-07-20 LAB — COMPREHENSIVE METABOLIC PANEL
ALK PHOS: 70 U/L (ref 39–117)
ALT: 31 U/L (ref 0–53)
AST: 97 U/L — AB (ref 0–37)
Albumin: 3.6 g/dL (ref 3.5–5.2)
Anion gap: 10 (ref 5–15)
BILIRUBIN TOTAL: 1 mg/dL (ref 0.3–1.2)
BUN: 6 mg/dL (ref 6–23)
CHLORIDE: 101 meq/L (ref 96–112)
CO2: 27 mEq/L (ref 19–32)
CREATININE: 0.87 mg/dL (ref 0.50–1.35)
Calcium: 9 mg/dL (ref 8.4–10.5)
GFR calc Af Amer: >90 mL/min (ref >90)
GFR calc non Af Amer: >90 mL/min (ref >90)
Glucose, Bld: 93 mg/dL (ref 70–99)
POTASSIUM: 4 meq/L (ref 3.7–5.3)
Sodium: 138 mEq/L (ref 137–147)
Total Protein: 6.9 g/dL (ref 6.0–8.3)

## 2014-07-20 LAB — CBC
HEMATOCRIT: 41.8 % (ref 39.0–52.0)
Hemoglobin: 13.9 g/dL (ref 13.0–17.0)
MCH: 27.9 pg (ref 26.0–34.0)
MCHC: 33.3 g/dL (ref 30.0–36.0)
MCV: 83.8 fL (ref 78.0–100.0)
Platelets: 233 10*3/uL (ref 150–400)
RBC: 4.99 MIL/uL (ref 4.22–5.81)
RDW: 13.6 % (ref 11.5–15.5)
WBC: 7.7 10*3/uL (ref 4.0–10.5)

## 2014-07-20 LAB — MAGNESIUM: Magnesium: 2.1 mg/dL (ref 1.5–2.5)

## 2014-07-20 LAB — CK: CK TOTAL: 4907 U/L — AB (ref 7–232)

## 2014-07-20 LAB — PHOSPHORUS: Phosphorus: 3.8 mg/dL (ref 2.3–4.6)

## 2014-07-20 NOTE — Progress Notes (Addendum)
PROGRESS NOTE  Patrick Wong ZOX:[id number] DOB: Jun 18, 1991 DOA: 07/18/2014 PCP: No primary provider on file.  HPI: 23 y.o. male has a past medical history significant for depression, schizophrenia not currently on any medication is being brought by his girlfriend after being found down. He apparently took 6 weeks worth of Benztropine 1 mg BID, Venlafaxine ER 150 mg BID, Haldol 10 mg QHS, Seroquel 200 mg QHS, Mirtazapine 15, 1/2 tablet QHS and Haldol 2 mg QHS.  Subjective/ 24 H Interval events - confusion improving, able to talk more  Assessment/Plan: Overdose with 5 different agents  - continue supportive care, he is doing better this morning, transfer to telemetry today - psychiatry consulted this morning - mydriasis better this morning, hyperreflexia and clonus improving - QTC stable - seizure precautions  - neuro checks - Ativan PRN for agitation/seizures  Hypoglycemia - D5 1/2 NS  - continue NPO for now Depression - psych consult - suicidal intent not clear at this point  - ask for sitter  History of schizophrenia - psych consult Rhabdomyolysis - monitor CK, better, due to immobilization and drug induced agitation - IVF, improving Right shoulder pain  - patient found down, unclear if fall or not, obtain right shoulder Xray today  - patient not fully cooperating with XRAY, repeat with full shoulder series. May need ortho  Diet: NPO Fluids: D5 1/2 NS at 125 cc/h DVT Prophylaxis: Lovenox  Code Status: Full Family Communication: d/w girlfriend bedside  Disposition Plan: inpatient, telemetry transfer today  Consultants:  None   Procedures:  None    Antibiotics None   Studies  Filed Vitals:   07/20/14 0300 07/20/14 0400 07/20/14 0420 07/20/14 0745  BP:   126/83 134/96  Pulse: 74  69   Temp:  97.5 F (36.4 C)  97.8 F (36.6 C)  TempSrc:  Oral  Oral  Resp: 16  20 19   Height:      Weight:      SpO2: 94%  95% 98%    Intake/Output Summary (Last 24  hours) at 07/20/14 0821 Last data filed at 07/20/14 0600  Gross per 24 hour  Intake   2750 ml  Output   3075 ml  Net   -325 ml   Filed Weights   07/18/14 1800 07/19/14 0745  Weight: 62.3 kg (137 lb 5.6 oz) 61.8 kg (136 lb 3.9 oz)    Exam:  General:  No apparent distress, calm this morning, following commands, still appears to have hallucinations  Cardiovascular: RRR  Respiratory: clear on anterior auscultation  Abdomen: soft  MSK: no edema  Neuro: moves all 4, follows commands   Data Reviewed: Basic Metabolic Panel:  Recent Labs Lab 07/18/14 1458 07/18/14 1514 07/19/14 0335 07/20/14 0341  NA 140  --  136* 138  K 4.8  --  4.1 4.0  CL 103  --  101 101  CO2 23  --  23 27  GLUCOSE 64*  --  94 93  BUN 12  --  8 6  CREATININE 0.94  --  0.92 0.87  CALCIUM 9.3  --  9.0 9.0  MG  --  2.5 2.1 2.1  PHOS  --   --  3.4 3.8   Liver Function Tests:  Recent Labs Lab 07/18/14 1458 07/19/14 0335 07/20/14 0341  AST 27 70* 97*  ALT 10 14 31   ALKPHOS 83 78 70  BILITOT 0.5 1.1 1.0  PROT 7.9 7.1 6.9  ALBUMIN 4.6 4.0 3.6   CBC:  Recent Labs Lab 07/18/14 1458 07/19/14 0335 07/20/14 0341  WBC 11.6* 11.1* 7.7  NEUTROABS 9.7* 8.3*  --   HGB 15.0 13.6 13.9  HCT 42.5 40.1 41.8  MCV 80.6 81.8 83.8  PLT 264 226 233   Cardiac Enzymes:  Recent Labs Lab 07/18/14 1514 07/19/14 0335 07/19/14 1656 07/20/14 0341  CKTOTAL 1114* 4846* 6336* 4907*   CBG:  Recent Labs Lab 07/18/14 1740 07/19/14 0149  GLUCAP 121* 99    Recent Results (from the past 240 hour(s))  MRSA PCR SCREENING     Status: None   Collection Time    07/18/14  5:54 PM      Result Value Ref Range Status   MRSA by PCR NEGATIVE  NEGATIVE Final   Comment:            The GeneXpert MRSA Assay (FDA     approved for NASAL specimens     only), is one component of a     comprehensive MRSA colonization     surveillance program. It is not     intended to diagnose MRSA     infection nor to guide or      monitor treatment for     MRSA infections.     Studies: Dg Chest Portable 1 View 07/18/2014 Posterior fusion from T5-L1. Findings stable since prior thoracic spine series. Lungs are clear. Heart is normal size. No effusions or acute bony abnormality. CT head 10/12 Technically challenging examination without acute abnormality  Scheduled Meds: . antiseptic oral rinse  7 mL Mouth Rinse q12n4p  . chlorhexidine  15 mL Mouth Rinse BID  . enoxaparin (LOVENOX) injection  40 mg Subcutaneous Q24H  . sodium chloride  3 mL Intravenous Q12H   Continuous Infusions: . dextrose 5 % and 0.45% NaCl 125 mL/hr at 07/19/14 2012    Active Problems:   Overdose   Depression   Prolonged QT interval   Leukocytosis   Rhabdomyolysis   Serotonin syndrome   Anticholinergic syndrome   History of schizophrenia   Time spent: 5625  Pamella Pertostin Gherghe, MD [facility] Pager [phone]. If 7 PM - 7 AM, please contact night-coverage at [email or web address], password [email or web address] 07/20/2014, 8:21 AM  LOS: 2 days

## 2014-07-20 NOTE — Progress Notes (Signed)
Patient ID: Patrick LarkLaquan L Wong, male   DOB: 1991-05-12, 23 y.o.   MRN: [id number]  Psychiatric consultation with case manager attempted for psych evaluation. Patient has received ativan prior to this visit as per staff and he was agitated and aggressive with staff RN in ICU. Patient is not able to participate due to sedation at this time. Will try to evaluate later today or tomorrow. Reviewed the information documented and agree with the treatment plan.  Opie Maclaughlin,JANARDHAHA R. 07/20/2014 12:54 PM

## 2014-07-20 NOTE — Progress Notes (Signed)
Clinical Social Work  CSW and psych MD attempted to meet with patient. Patient sleeping and would not awaken for assessment. CSW will follow up at later time.  PierpontHolly Casmira Cramer, Kentucky[place]CSW [phone]

## 2014-07-20 NOTE — Consult Note (Signed)
[facility] Face-to-Face Psychiatry Consult   Reason for Consult:  Mood swings, psychosis and suicidal attempt with multiple medications Referring Physician:  Caren Griffins, MD  Kerby Less is an 23 y.o. male. Total Time spent with patient: 45 minutes  Assessment: AXIS I:  Schizoaffective Disorder AXIS II:  Cluster B Traits AXIS III:   Past Medical History  Diagnosis Date  . Scoliosis   . Depression   . Schizophrenia    AXIS IV:  occupational problems, other psychosocial or environmental problems, problems related to social environment and problems with primary support group AXIS V:  31-40 impairment in reality testing  Plan:  Case discussed with Costin Karlyne Greenspan, MD Recommend no psych medication except supportive care and continue safety sitter Recommend psychiatric Inpatient admission when medically cleared. Supportive therapy provided about ongoing stressors. Appreciate psychiatric consultation Please contact [phone] if needs further assistance  Subjective:   GAYLEN VENNING is a 23 y.o. male patient admitted with overdose with multiple medications.  HPI: HAZE ANTILLON is a 23 y.o. Male, unemployed, staying with his GF and her grandma. Patient came to [facility] with overdose of multiple medications as a suicide attempt. He has a past medical history significant for depression, schizophrenia not currently on any medication is being brought by his girlfriend after being found down. Patient complained feeling irritability, agitation, mood swings and hitting and punching the walls in porch at home and visual and command hallucinations and his mother took him to [place] about two weeks ago and given the diagnosis of schizophrenia. Patient stated that he has thought block for the incident of overdose with multiple medications. He has no previous suicidal attempt and psych hospitalization. He has been smoking tobacco,1 PPD, occasional cannabis and 3 beers a day. He stopped working in [employer] about a year ago due to lack of transportation. Reportedly he overdosed on benztropine 1 mg, Venlafaxine ER 150 mg, Haldol 10 mg, Seroquel 200 mg, mirtazapine 15 mg and haldol 2 mg prescribed for someone named Carron Brazen (per booklet notes). It appears that these booklets were full initially and now all medications (6 week supply) are missing. Few days prior to this incident at some point he appeared more withdrawn and was asking his girlfriend if she also is hearing "these voices". Patient reported his mother and father has mental illness.   HPI Elements:   Location:  mood swings and psychosis. Quality:  not able to function both at home and work. Severity:  suicidal attempt with multiple medications. Timing:  unknown stresses.  Past Psychiatric History: Past Medical History  Diagnosis Date  . Scoliosis   . Depression   . Schizophrenia     reports that he has been smoking.  He does not have any smokeless tobacco history on file. He reports that he drinks alcohol. He reports that he uses illicit drugs (Marijuana). No family history on file.   Living Arrangements: Spouse/significant other   Abuse/Neglect [facility]) Physical Abuse: Denies Verbal Abuse: Denies Sexual Abuse: Denies Allergies:  No Known Allergies  ACT Assessment Complete:  NO Objective: Blood pressure 134/96, pulse 69, temperature 97.8 F (36.6 C), temperature source Oral, resp. rate 19, height 6' 1"  (1.854 m), weight 61.8 kg (136 lb 3.9 oz), SpO2 98.00%.Body mass index is 17.98 kg/(m^2). Results for orders placed during the hospital encounter of 07/18/14 (from the past 72 hour(s))  CBC WITH DIFFERENTIAL     Status: Abnormal   Collection Time    07/18/14  2:58 PM      Result Value Ref Range   WBC 11.6 (*) 4.0 - 10.5 K/uL   RBC 5.27  4.22 - 5.81 MIL/uL   Hemoglobin 15.0  13.0 - 17.0 g/dL   HCT 42.5  39.0 - 52.0 %   MCV 80.6  78.0 - 100.0 fL   MCH 28.5  26.0 - 34.0 pg   MCHC 35.3  30.0 - 36.0 g/dL    RDW 13.7  11.5 - 15.5 %   Platelets 264  150 - 400 K/uL   Neutrophils Relative % 84 (*) 43 - 77 %   Neutro Abs 9.7 (*) 1.7 - 7.7 K/uL   Lymphocytes Relative 16  12 - 46 %   Lymphs Abs 1.8  0.7 - 4.0 K/uL   Monocytes Relative 0 (*) 3 - 12 %   Monocytes Absolute 0.0 (*) 0.1 - 1.0 K/uL   Eosinophils Relative 0  0 - 5 %   Eosinophils Absolute 0.0  0.0 - 0.7 K/uL   Basophils Relative 0  0 - 1 %   Basophils Absolute 0.0  0.0 - 0.1 K/uL  COMPREHENSIVE METABOLIC PANEL     Status: Abnormal   Collection Time    07/18/14  2:58 PM      Result Value Ref Range   Sodium 140  137 - 147 mEq/L   Potassium 4.8  3.7 - 5.3 mEq/L   Chloride 103  96 - 112 mEq/L   CO2 23  19 - 32 mEq/L   Glucose, Bld 64 (*) 70 - 99 mg/dL   BUN 12  6 - 23 mg/dL   Creatinine, Ser 0.94  0.50 - 1.35 mg/dL   Calcium 9.3  8.4 - 10.5 mg/dL   Total Protein 7.9  6.0 - 8.3 g/dL   Albumin 4.6  3.5 - 5.2 g/dL   AST 27  0 - 37 U/L   Comment: SLIGHT HEMOLYSIS     HEMOLYSIS AT THIS LEVEL MAY AFFECT RESULT   ALT 10  0 - 53 U/L   Alkaline Phosphatase 83  39 - 117 U/L   Total Bilirubin 0.5  0.3 - 1.2 mg/dL   GFR calc non Af Amer >90  >90 mL/min   GFR calc Af Amer >90  >90 mL/min   Comment: (NOTE)     The eGFR has been calculated using the CKD EPI equation.     This calculation has not been validated in all clinical situations.     eGFR's persistently <90 mL/min signify possible Chronic Kidney     Disease.   Anion gap 14  5 - 15  ACETAMINOPHEN LEVEL     Status: None   Collection Time    07/18/14  2:58 PM      Result Value Ref Range   Acetaminophen (Tylenol), Serum <15.0  10 - 30 ug/mL   Comment:            THERAPEUTIC CONCENTRATIONS VARY     SIGNIFICANTLY. A RANGE OF 10-30     ug/mL MAY BE AN EFFECTIVE     CONCENTRATION FOR MANY PATIENTS.     HOWEVER, SOME ARE BEST TREATED     AT CONCENTRATIONS OUTSIDE THIS     RANGE.     ACETAMINOPHEN CONCENTRATIONS     >150 ug/mL AT 4 HOURS AFTER     INGESTION AND >50 ug/mL AT 12      HOURS AFTER INGESTION ARE     OFTEN ASSOCIATED WITH TOXIC  REACTIONS.  SALICYLATE LEVEL     Status: Abnormal   Collection Time    07/18/14  2:58 PM      Result Value Ref Range   Salicylate Lvl <9.7 (*) 2.8 - 20.0 mg/dL  ETHANOL     Status: None   Collection Time    07/18/14  2:58 PM      Result Value Ref Range   Alcohol, Ethyl (B) <11  0 - 11 mg/dL   Comment:            LOWEST DETECTABLE LIMIT FOR     SERUM ALCOHOL IS 11 mg/dL     FOR MEDICAL PURPOSES ONLY  CK     Status: Abnormal   Collection Time    07/18/14  3:14 PM      Result Value Ref Range   Total CK 1114 (*) 7 - 232 U/L  MAGNESIUM     Status: None   Collection Time    07/18/14  3:14 PM      Result Value Ref Range   Magnesium 2.5  1.5 - 2.5 mg/dL  URINALYSIS, ROUTINE W REFLEX MICROSCOPIC     Status: Abnormal   Collection Time    07/18/14  3:22 PM      Result Value Ref Range   Color, Urine YELLOW  YELLOW   APPearance CLOUDY (*) CLEAR   Specific Gravity, Urine 1.022  1.005 - 1.030   pH 5.0  5.0 - 8.0   Glucose, UA NEGATIVE  NEGATIVE mg/dL   Hgb urine dipstick SMALL (*) NEGATIVE   Bilirubin Urine NEGATIVE  NEGATIVE   Ketones, ur 15 (*) NEGATIVE mg/dL   Protein, ur NEGATIVE  NEGATIVE mg/dL   Urobilinogen, UA 0.2  0.0 - 1.0 mg/dL   Nitrite NEGATIVE  NEGATIVE   Leukocytes, UA NEGATIVE  NEGATIVE  URINE RAPID DRUG SCREEN (HOSP PERFORMED)     Status: Abnormal   Collection Time    07/18/14  3:22 PM      Result Value Ref Range   Opiates NONE DETECTED  NONE DETECTED   Cocaine NONE DETECTED  NONE DETECTED   Benzodiazepines NONE DETECTED  NONE DETECTED   Amphetamines NONE DETECTED  NONE DETECTED   Tetrahydrocannabinol POSITIVE (*) NONE DETECTED   Barbiturates NONE DETECTED  NONE DETECTED   Comment:            DRUG SCREEN FOR MEDICAL PURPOSES     ONLY.  IF CONFIRMATION IS NEEDED     FOR ANY PURPOSE, NOTIFY LAB     WITHIN 5 DAYS.                LOWEST DETECTABLE LIMITS     FOR URINE DRUG SCREEN     Drug Class        Cutoff (ng/mL)     Amphetamine      1000     Barbiturate      200     Benzodiazepine   989     Tricyclics       211     Opiates          300     Cocaine          300     THC              50  URINE MICROSCOPIC-ADD ON     Status: None   Collection Time    07/18/14  3:22 PM      Result Value Ref  Range   Squamous Epithelial / LPF RARE  RARE   WBC, UA 0-2  <3 WBC/hpf   RBC / HPF 0-2  <3 RBC/hpf  BLOOD GAS, ARTERIAL     Status: None   Collection Time    07/18/14  3:30 PM      Result Value Ref Range   pH, Arterial 7.381  7.350 - 7.450   pCO2 arterial 39.6  35.0 - 45.0 mmHg   pO2, Arterial 95.7  80.0 - 100.0 mmHg   Bicarbonate 22.9  20.0 - 24.0 mEq/L   TCO2 20.0  0 - 100 mmol/L   Acid-base deficit 1.4  0.0 - 2.0 mmol/L   O2 Saturation 97.0     Patient temperature 98.6     Collection site RIGHT RADIAL     Drawn by [id number]     Sample type ARTERIAL DRAW     Allens test (pass/fail) PASS  PASS  CBG MONITORING, ED     Status: Abnormal   Collection Time    07/18/14  5:40 PM      Result Value Ref Range   Glucose-Capillary 121 (*) 70 - 99 mg/dL  MRSA PCR SCREENING     Status: None   Collection Time    07/18/14  5:54 PM      Result Value Ref Range   MRSA by PCR NEGATIVE  NEGATIVE   Comment:            The GeneXpert MRSA Assay (FDA     approved for NASAL specimens     only), is one component of a     comprehensive MRSA colonization     surveillance program. It is not     intended to diagnose MRSA     infection nor to guide or     monitor treatment for     MRSA infections.  GLUCOSE, CAPILLARY     Status: None   Collection Time    07/19/14  1:49 AM      Result Value Ref Range   Glucose-Capillary 99  70 - 99 mg/dL  CBC WITH DIFFERENTIAL     Status: Abnormal   Collection Time    07/19/14  3:35 AM      Result Value Ref Range   WBC 11.1 (*) 4.0 - 10.5 K/uL   RBC 4.90  4.22 - 5.81 MIL/uL   Hemoglobin 13.6  13.0 - 17.0 g/dL   HCT 40.1  39.0 - 52.0 %   MCV 81.8  78.0 - 100.0 fL    MCH 27.8  26.0 - 34.0 pg   MCHC 33.9  30.0 - 36.0 g/dL   RDW 13.6  11.5 - 15.5 %   Platelets 226  150 - 400 K/uL   Neutrophils Relative % 75  43 - 77 %   Neutro Abs 8.3 (*) 1.7 - 7.7 K/uL   Lymphocytes Relative 13  12 - 46 %   Lymphs Abs 1.4  0.7 - 4.0 K/uL   Monocytes Relative 11  3 - 12 %   Monocytes Absolute 1.2 (*) 0.1 - 1.0 K/uL   Eosinophils Relative 1  0 - 5 %   Eosinophils Absolute 0.1  0.0 - 0.7 K/uL   Basophils Relative 0  0 - 1 %   Basophils Absolute 0.0  0.0 - 0.1 K/uL  COMPREHENSIVE METABOLIC PANEL     Status: Abnormal   Collection Time    07/19/14  3:35 AM      Result Value Ref Range  Sodium 136 (*) 137 - 147 mEq/L   Potassium 4.1  3.7 - 5.3 mEq/L   Chloride 101  96 - 112 mEq/L   CO2 23  19 - 32 mEq/L   Glucose, Bld 94  70 - 99 mg/dL   BUN 8  6 - 23 mg/dL   Creatinine, Ser 0.92  0.50 - 1.35 mg/dL   Calcium 9.0  8.4 - 10.5 mg/dL   Total Protein 7.1  6.0 - 8.3 g/dL   Albumin 4.0  3.5 - 5.2 g/dL   AST 70 (*) 0 - 37 U/L   ALT 14  0 - 53 U/L   Alkaline Phosphatase 78  39 - 117 U/L   Total Bilirubin 1.1  0.3 - 1.2 mg/dL   GFR calc non Af Amer >90  >90 mL/min   GFR calc Af Amer >90  >90 mL/min   Comment: (NOTE)     The eGFR has been calculated using the CKD EPI equation.     This calculation has not been validated in all clinical situations.     eGFR's persistently <90 mL/min signify possible Chronic Kidney     Disease.   Anion gap 12  5 - 15  PROTIME-INR     Status: None   Collection Time    07/19/14  3:35 AM      Result Value Ref Range   Prothrombin Time 14.5  11.6 - 15.2 seconds   INR 1.12  0.00 - 1.49  CK     Status: Abnormal   Collection Time    07/19/14  3:35 AM      Result Value Ref Range   Total CK 4846 (*) 7 - 232 U/L  PHOSPHORUS     Status: None   Collection Time    07/19/14  3:35 AM      Result Value Ref Range   Phosphorus 3.4  2.3 - 4.6 mg/dL  MAGNESIUM     Status: None   Collection Time    07/19/14  3:35 AM      Result Value Ref Range    Magnesium 2.1  1.5 - 2.5 mg/dL  CK     Status: Abnormal   Collection Time    07/19/14  4:56 PM      Result Value Ref Range   Total CK 6336 (*) 7 - 232 U/L  COMPREHENSIVE METABOLIC PANEL     Status: Abnormal   Collection Time    07/20/14  3:41 AM      Result Value Ref Range   Sodium 138  137 - 147 mEq/L   Potassium 4.0  3.7 - 5.3 mEq/L   Chloride 101  96 - 112 mEq/L   CO2 27  19 - 32 mEq/L   Glucose, Bld 93  70 - 99 mg/dL   BUN 6  6 - 23 mg/dL   Creatinine, Ser 0.87  0.50 - 1.35 mg/dL   Calcium 9.0  8.4 - 10.5 mg/dL   Total Protein 6.9  6.0 - 8.3 g/dL   Albumin 3.6  3.5 - 5.2 g/dL   AST 97 (*) 0 - 37 U/L   ALT 31  0 - 53 U/L   Alkaline Phosphatase 70  39 - 117 U/L   Total Bilirubin 1.0  0.3 - 1.2 mg/dL   GFR calc non Af Amer >90  >90 mL/min   GFR calc Af Amer >90  >90 mL/min   Comment: (NOTE)     The eGFR has been calculated  using the CKD EPI equation.     This calculation has not been validated in all clinical situations.     eGFR's persistently <90 mL/min signify possible Chronic Kidney     Disease.   Anion gap 10  5 - 15  CBC     Status: None   Collection Time    07/20/14  3:41 AM      Result Value Ref Range   WBC 7.7  4.0 - 10.5 K/uL   RBC 4.99  4.22 - 5.81 MIL/uL   Hemoglobin 13.9  13.0 - 17.0 g/dL   HCT 41.8  39.0 - 52.0 %   MCV 83.8  78.0 - 100.0 fL   MCH 27.9  26.0 - 34.0 pg   MCHC 33.3  30.0 - 36.0 g/dL   RDW 13.6  11.5 - 15.5 %   Platelets 233  150 - 400 K/uL  MAGNESIUM     Status: None   Collection Time    07/20/14  3:41 AM      Result Value Ref Range   Magnesium 2.1  1.5 - 2.5 mg/dL  PHOSPHORUS     Status: None   Collection Time    07/20/14  3:41 AM      Result Value Ref Range   Phosphorus 3.8  2.3 - 4.6 mg/dL  CK     Status: Abnormal   Collection Time    07/20/14  3:41 AM      Result Value Ref Range   Total CK 4907 (*) 7 - 232 U/L   Labs are reviewed.  Current Facility-Administered Medications  Medication Dose Route Frequency Provider  Last Rate Last Dose  . antiseptic oral rinse (CPC / CETYLPYRIDINIUM CHLORIDE 0.05%) solution 7 mL  7 mL Mouth Rinse q12n4p Costin Karlyne Greenspan, MD   7 mL at 07/19/14 1800  . chlorhexidine (PERIDEX) 0.12 % solution 15 mL  15 mL Mouth Rinse BID Caren Griffins, MD   15 mL at 07/20/14 0742  . dextrose 5 %-0.45 % sodium chloride infusion   Intravenous Continuous Caren Griffins, MD 125 mL/hr at 07/20/14 0955    . enoxaparin (LOVENOX) injection 40 mg  40 mg Subcutaneous Q24H Caren Griffins, MD   40 mg at 07/19/14 2140  . LORazepam (ATIVAN) injection 1 mg  1 mg Intravenous Q4H PRN Caren Griffins, MD   1 mg at 07/20/14 0919  . sodium chloride 0.9 % injection 3 mL  3 mL Intravenous Q12H Caren Griffins, MD   3 mL at 07/19/14 2141    Psychiatric Specialty Exam: Physical Exam as per history and physical  Review of Systems  Constitutional: Positive for malaise/fatigue.  Eyes: Positive for blurred vision.  Musculoskeletal: Positive for myalgias.  Neurological: Positive for weakness and headaches.  Psychiatric/Behavioral: Positive for depression, hallucinations, memory loss and substance abuse. The patient is nervous/anxious.     Blood pressure 134/96, pulse 69, temperature 97.8 F (36.6 C), temperature source Oral, resp. rate 19, height 6' 1"  (4.403 m), weight 61.8 kg (136 lb 3.9 oz), SpO2 98.00%.Body mass index is 17.98 kg/(m^2).  General Appearance: Bizarre, Disheveled and Guarded  Eye Contact::  Fair  Speech:  Blocked, Slow and Slurred  Volume:  Decreased  Mood:  Anxious and Depressed  Affect:  Blunt and Depressed  Thought Process:  Disorganized, Irrelevant and Loose  Orientation:  NA  Thought Content:  Delusions and Hallucinations: Auditory Visual  Suicidal Thoughts:  Yes.  with intent/plan  Homicidal Thoughts:  No  Memory:  Immediate;   Poor Recent;   Poor  Judgement:  Impaired  Insight:  Lacking  Psychomotor Activity:  Decreased  Concentration:  Poor  Recall:  Poor  Fund of  Knowledge:Fair  Language: Fair  Akathisia:  NA  Handed:  Right  AIMS (if indicated):     Assets:  Communication Skills Desire for Improvement Housing Intimacy Leisure Time Physical Health Resilience  Sleep:      Musculoskeletal: Strength & Muscle Tone: within normal limits Gait & Station: normal Patient leans: N/A  Treatment Plan Summary: Daily contact with patient to assess and evaluate symptoms and progress in treatment Medication management  Layonna Dobie,JANARDHAHA R. 07/20/2014 10:35 AM

## 2014-07-20 NOTE — Progress Notes (Signed)
Xray called to ask about the patient's shoulder xray. They inquired if the patient was going to be able to stand. Pt is currently not able to stand and follow commands. Per Xray order patient needed to be able to stand. Will communicate this to day shift and see if the patient is more alert and able to follow commands. Will continue to monitor and will communicate to day shift.

## 2014-07-21 LAB — CBC
HCT: 41.5 % (ref 39.0–52.0)
HEMOGLOBIN: 13.9 g/dL (ref 13.0–17.0)
MCH: 27.5 pg (ref 26.0–34.0)
MCHC: 33.5 g/dL (ref 30.0–36.0)
MCV: 82.2 fL (ref 78.0–100.0)
PLATELETS: 262 10*3/uL (ref 150–400)
RBC: 5.05 MIL/uL (ref 4.22–5.81)
RDW: 13.5 % (ref 11.5–15.5)
WBC: 6.5 10*3/uL (ref 4.0–10.5)

## 2014-07-21 LAB — COMPREHENSIVE METABOLIC PANEL
ALK PHOS: 71 U/L (ref 39–117)
ALT: 44 U/L (ref 0–53)
ANION GAP: 14 (ref 5–15)
AST: 80 U/L — ABNORMAL HIGH (ref 0–37)
Albumin: 3.5 g/dL (ref 3.5–5.2)
BUN: 4 mg/dL — AB (ref 6–23)
CO2: 25 mEq/L (ref 19–32)
Calcium: 9 mg/dL (ref 8.4–10.5)
Chloride: 103 mEq/L (ref 96–112)
Creatinine, Ser: 0.92 mg/dL (ref 0.50–1.35)
Glucose, Bld: 109 mg/dL — ABNORMAL HIGH (ref 70–99)
Potassium: 3.6 mEq/L — ABNORMAL LOW (ref 3.7–5.3)
Sodium: 142 mEq/L (ref 137–147)
TOTAL PROTEIN: 7.1 g/dL (ref 6.0–8.3)
Total Bilirubin: 1 mg/dL (ref 0.3–1.2)

## 2014-07-21 LAB — CK: CK TOTAL: 3947 U/L — AB (ref 7–232)

## 2014-07-21 MED ORDER — NICOTINE 14 MG/24HR TD PT24
14.0000 mg | MEDICATED_PATCH | Freq: Every day | TRANSDERMAL | Status: DC
Start: 2014-07-21 — End: 2014-07-23
  Administered 2014-07-21 – 2014-07-23 (×3): 14 mg via TRANSDERMAL
  Filled 2014-07-21 (×3): qty 1

## 2014-07-21 NOTE — Progress Notes (Signed)
Clinical Social Work Department CLINICAL SOCIAL WORK PSYCHIATRY SERVICE LINE ASSESSMENT 07/21/2014  Patient:  Patrick Wong  Account:  [id number]  [place] Date:  07/18/2014  Clinical Social Worker:  Sindy Messing, LCSW  Date/Time:  07/21/2014 11:30 AM Referred by:  Physician  Date referred:  07/21/2014 Reason for Referral  Psychosocial assessment   Presenting Symptoms/Problems (In the person's/family's own words):   Psych consulted for overdose.   Abuse/Neglect/Trauma History (check all that apply)  Denies history   Abuse/Neglect/Trauma Comments:   Psychiatric History (check all that apply)  Outpatient treatment   Psychiatric medications:  None currently   Current Mental Health Hospitalizations/Previous Mental Health History:   Patient reports he was diagnosed with schizophrenia about 2 weeks ago. Patient has been receiving medication management and reports compliance with medications.   Current provider:   Jodene Nam and Date:   [place], [place]   Current Medications:   Scheduled Meds:      . antiseptic oral rinse  7 mL Mouth Rinse q12n4p  . chlorhexidine  15 mL Mouth Rinse BID  . enoxaparin (LOVENOX) injection  40 mg Subcutaneous Q24H  . sodium chloride  3 mL Intravenous Q12H        Continuous Infusions:      . dextrose 5 % and 0.45% NaCl 125 mL/hr at 07/20/14 1810          PRN Meds:.LORazepam       Previous Impatient Admission/Date/Reason:   None reported   Emotional Health / Current Symptoms    Suicide/Self Harm  None reported   Suicide attempt in the past:   Patient denies any SI or HI. Patient denies that he overdosed on medication but cannot give a clear reason of why he was admitted to the hospital.   Other harmful behavior:   None reported   Psychotic/Dissociative Symptoms  Visual Hallucinations   Other Psychotic/Dissociative Symptoms:   Patient reports she has VH but denies any AH.    Attention/Behavioral Symptoms  Withdrawn   Other  Attention / Behavioral Symptoms:   Patient mumbled throughout assessment and disengaged.    Cognitive Impairment  Within Normal Limits   Other Cognitive Impairment:   Patient alert and oriented.    Mood and Adjustment  Flat    Stress, Anxiety, Trauma, Any Recent Loss/Stressor  None reported   Anxiety (frequency):   N/A   Phobia (specify):   N/A   Compulsive behavior (specify):   N/A   Obsessive behavior (specify):   N/A   Other:   N/A   Substance Abuse/Use  Current substance use   SBIRT completed (please refer for detailed history):  Y  Self-reported substance use:   Patient reports he drinks about 3 bottles of beer a day. Patient reports to occasional marijuana use. Patient denies any further substance use.   Urinary Drug Screen Completed:  Y Alcohol level:   <11    Environmental/Housing/Living Arrangement  Stable housing   Who is in the home:   Girlfriend and girlfriend's grandmother   Emergency contact:  None currently   Financial  IPRS   Patient's Strengths and Goals (patient's own words):   Patient reports supportive girlfriend.   Clinical Social Worker's Interpretive Summary:   CSW received referral to complete psychosocial assessment. CSW reviewed chart and met with patient with psych MD.    Patient reports he is currently living with girlfriend and her grandmother. Patient has not worked in over a year due to transportation issues. Patient reports that  he does not have any children but about 2 weeks ago mother took him to [facility] to get evaluated because he was angry and destroying property when he was bored. Patient's parents have been diagnosed with schizophrenia and patient was diagnosed as well. Patient reports he has been compliant with medications.    Patient denies any SI or HI. Patient denies any suicide attempts but is unable to provide any further information to why he was admitted. Patient is agreeable for CSW to contact family so  CSW left a message with mom.    Patient has flat affect and mumbled throughout assessment. Patient reports he wants to go home at DC but understands that psych MD will make recommendations.   Disposition:  Recommend Psych CSW continuing to support while in hospital   [place], [place] [phone]

## 2014-07-21 NOTE — Progress Notes (Signed)
Patrick LarkLaquan L Wong AVW:[id number] DOB: 1991/07/06 DOA: 07/18/2014 PCP: No primary provider on file.  HPI: 23 y.o. male has a past medical history significant for depression, schizophrenia not currently on any medication is being brought by his girlfriend after being found down. He apparently took 6 weeks worth of Benztropine 1 mg BID, Venlafaxine ER 150 mg BID, Haldol 10 mg QHS, Seroquel 200 mg QHS, Mirtazapine 15, 1/2 tablet QHS and Haldol 2 mg QHS.  Subjective/ 24 H Interval events  - doing fair. - hearing voices, male and male.  Doesn't elucidate further and still a little confused - denies depression or SI    Overdose with 5 different agents  - continue supportive care, he is doing better this morning, transfer to telemetry today - psychiatry consulted this morning - neuro checks - Ativan PRN for agitation/seizures  Hypoglycemia - D5 1/2 NS  - continue NPO for now Depression - psych consulted - suicidal intent not clear at this point  - ask for sitter  History of schizophrenia  - psych consult Rhabdomyolysis  - monitor CK, better, due to immobilization and drug induced agitation - IVF, improving Right shoulder pain  - patient found down, unclear if fall or not, xray sub-optimal - patient not fully cooperating with XRAY, repeat with full shoulder series. -if in pain in am, rpt shoulder  Diet: NPO Fluids: D5 1/2 NS at 125 cc/h DVT Prophylaxis: Lovenox  Code Status: Full Family Communication: no family + Disposition Plan: inpatient, telemetry transfer today  Consultants:  None   Procedures:  None    Antibiotics None   Studies  Filed Vitals:   07/20/14 1056 07/20/14 2113 07/21/14 0657 07/21/14 1325  BP: 115/87 128/78 107/59 130/71  Pulse: 64 82 73 76  Temp: 97.4 F (36.3 C) 98.2 F (36.8 C) 97.7 F (36.5 C) 98.2 F (36.8 C)  TempSrc: Axillary Oral Oral Oral  Resp: 19 20 18 20   Height:      Weight:      SpO2: 100% 98% 98% 100%     Intake/Output Summary (Last 24 hours) at 07/21/14 1549 Last data filed at 07/21/14 1422  Gross per 24 hour  Intake   3075 ml  Output   3825 ml  Net   -750 ml   Filed Weights   07/18/14 1800 07/19/14 0745  Weight: 62.3 kg (137 lb 5.6 oz) 61.8 kg (136 lb 3.9 oz)    Exam:  General:  No apparent distress, calm this morning, following commands, still appears to have hallucinations  Cardiovascular: RRR  Respiratory: clear on anterior auscultation  Abdomen: soft    Data Reviewed: Basic Metabolic Panel:  Recent Labs Lab 07/18/14 1458 07/18/14 1514 07/19/14 0335 07/20/14 0341 07/21/14 0401  NA 140  --  136* 138 142  K 4.8  --  4.1 4.0 3.6*  CL 103  --  101 101 103  CO2 23  --  23 27 25   GLUCOSE 64*  --  94 93 109*  BUN 12  --  8 6 4*  CREATININE 0.94  --  0.92 0.87 0.92  CALCIUM 9.3  --  9.0 9.0 9.0  MG  --  2.5 2.1 2.1  --   PHOS  --   --  3.4 3.8  --    Liver Function Tests:  Recent Labs Lab 07/18/14 1458 07/19/14 0335 07/20/14 0341 07/21/14 0401  AST 27 70* 97* 80*  ALT 10 14 31  44  ALKPHOS 83 78 70  71  BILITOT 0.5 1.1 1.0 1.0  PROT 7.9 7.1 6.9 7.1  ALBUMIN 4.6 4.0 3.6 3.5   CBC:  Recent Labs Lab 07/18/14 1458 07/19/14 0335 07/20/14 0341 07/21/14 0401  WBC 11.6* 11.1* 7.7 6.5  NEUTROABS 9.7* 8.3*  --   --   HGB 15.0 13.6 13.9 13.9  HCT 42.5 40.1 41.8 41.5  MCV 80.6 81.8 83.8 82.2  PLT 264 226 233 262   Cardiac Enzymes:  Recent Labs Lab 07/18/14 1514 07/19/14 0335 07/19/14 1656 07/20/14 0341 07/21/14 0401  CKTOTAL 1114* 4846* 6336* 4907* 3947*   CBG:  Recent Labs Lab 07/18/14 1740 07/19/14 0149  GLUCAP 121* 99    Recent Results (from the past 240 hour(s))  MRSA PCR SCREENING     Status: None   Collection Time    07/18/14  5:54 PM      Result Value Ref Range Status   MRSA by PCR NEGATIVE  NEGATIVE Final   Comment:            The GeneXpert MRSA Assay (FDA     approved for NASAL specimens     only), is one component of  a     comprehensive MRSA colonization     surveillance program. It is not     intended to diagnose MRSA     infection nor to guide or     monitor treatment for     MRSA infections.     Studies: Dg Chest Portable 1 View 07/18/2014 Posterior fusion from T5-L1. Findings stable since prior thoracic spine series. Lungs are clear. Heart is normal size. No effusions or acute bony abnormality. CT head 10/12 Technically challenging examination without acute abnormality  Scheduled Meds: . antiseptic oral rinse  7 mL Mouth Rinse q12n4p  . chlorhexidine  15 mL Mouth Rinse BID  . enoxaparin (LOVENOX) injection  40 mg Subcutaneous Q24H  . sodium chloride  3 mL Intravenous Q12H   Continuous Infusions: . dextrose 5 % and 0.45% NaCl 125 mL/hr at 07/20/14 1810    Active Problems:   Overdose   Depression   Prolonged QT interval   Leukocytosis   Rhabdomyolysis   Serotonin syndrome   Anticholinergic syndrome   History of schizophrenia   Time spent: 9025  Pleas KochJai Orvel Cutsforth, MD [facility] (P) [phone]

## 2014-07-22 ENCOUNTER — Inpatient Hospital Stay (HOSPITAL_COMMUNITY): Payer: Self-pay

## 2014-07-22 DIAGNOSIS — F332 Major depressive disorder, recurrent severe without psychotic features: Secondary | ICD-10-CM

## 2014-07-22 MED ORDER — ENSURE COMPLETE PO LIQD
237.0000 mL | Freq: Three times a day (TID) | ORAL | Status: DC
Start: 2014-07-22 — End: 2014-07-23
  Administered 2014-07-22 – 2014-07-23 (×3): 237 mL via ORAL

## 2014-07-22 NOTE — Progress Notes (Signed)
[facility] MD Progress Note  07/22/2014 1:45 PM Patrick Wong  MRN:  [id number] Subjective:  Kerby Less grandma is in his room along with safety sitter. Patient has been sleeping and his grandma stated that he is able to stay up and talk to his medical doctors today. Patient admitted status post overdose of multiple medications as a suicide attempt.   He has a past medical history significant for depression, schizophrenia not currently on any medication. Patient complained feeling irritability, agitation, mood swings and hitting and punching the walls in porch at home and visual and command hallucinations and his mother took him to [place] about two weeks ago and given the diagnosis of schizophrenia. Patient stated that he has thought block for the incident of overdose with multiple medications. He has no previous suicidal attempt and psych hospitalization. He has been smoking tobacco,1 PPD, occasional cannabis and 3 beers a day. He stopped working in [employer] about a year ago due to lack of transportation. Reportedly he overdosed on benztropine 1 mg, Venlafaxine ER 150 mg, Haldol 10 mg, Seroquel 200 mg, mirtazapine 15 mg and haldol 2 mg prescribed for someone named Carron Brazen (per booklet notes). It appears that these booklets were full initially and now all medications (6 week supply) are missing. Few days prior to this incident at some point he appeared more withdrawn and was asking his girlfriend if she also is hearing "these voices". Patient reported his mother and father has mental illness.  Diagnosis:   DSM5: Schizophrenia Disorders:  Schizophrenia (295.7) Obsessive-Compulsive Disorders:   Trauma-Stressor Disorders:   Substance/Addictive Disorders:   Depressive Disorders:  Major Depressive Disorder - Severe (296.23) Total Time spent with patient: 30 minutes  Axis I: Major Depression, Recurrent severe and Schizoaffective Disorder  ADL's:  Impaired  Sleep:  Good  Appetite:  Fair  Suicidal Ideation:  Status post suicidal attempt Homicidal Ideation:  denied AEB (as evidenced by):  Psychiatric Specialty Exam: Physical Exam  ROS  Blood pressure 109/58, pulse 57, temperature 98.2 F (36.8 C), temperature source Oral, resp. rate 18, height _0  (1.854 m), weight 61.8 kg (136 lb 3.9 oz), SpO2 99.00%.Body mass index is 17.98 kg/(m^2).  General Appearance: Disheveled and Guarded  Eye Contact::  Good  Speech:  Clear and Coherent and Slow  Volume:  Decreased  Mood:  Angry, Anxious and Depressed  Affect:  Constricted and Depressed  Thought Process:  Coherent and Goal Directed  Orientation:  Full (Time, Place, and Person)  Thought Content:  Rumination  Suicidal Thoughts:  Yes.  with intent/plan  Homicidal Thoughts:  No  Memory:  Immediate;   Fair Recent;   Fair  Judgement:  Impaired  Insight:  Lacking  Psychomotor Activity:  Decreased  Concentration:  Fair  Recall:  [place] of Knowledge:Good  Language: Good  Akathisia:  NA  Handed:  Right  AIMS (if indicated):     Assets:  Communication Skills Desire for Improvement Housing Leisure Time Physical Health Resilience Social Support Transportation  Sleep:      Musculoskeletal: Strength & Muscle Tone: within normal limits Gait & Station: normal Patient leans: N/A  Current Medications: Current Facility-Administered Medications  Medication Dose Route Frequency Provider Last Rate Last Dose  . antiseptic oral rinse (CPC / CETYLPYRIDINIUM CHLORIDE 0.05%) solution 7 mL  7 mL Mouth Rinse q12n4p Costin Karlyne Greenspan, MD   7 mL at 07/19/14 1800  . chlorhexidine (PERIDEX) 0.12 % solution 15 mL  15 mL Mouth Rinse  BID Caren Griffins, MD   15 mL at 07/22/14 1047  . dextrose 5 %-0.45 % sodium chloride infusion   Intravenous Continuous Caren Griffins, MD 125 mL/hr at 07/22/14 0533    . enoxaparin (LOVENOX) injection 40 mg  40 mg Subcutaneous Q24H Caren Griffins, MD   40 mg at 07/21/14  2010  . feeding supplement (ENSURE COMPLETE) (ENSURE COMPLETE) liquid 237 mL  237 mL Oral TID BM Dorann Ou, RD      . LORazepam (ATIVAN) injection 1 mg  1 mg Intravenous Q4H PRN Caren Griffins, MD   1 mg at 07/20/14 2145  . nicotine (NICODERM CQ - dosed in mg/24 hours) patch 14 mg  14 mg Transdermal Daily Nita Sells, MD   14 mg at 07/22/14 1049  . sodium chloride 0.9 % injection 3 mL  3 mL Intravenous Q12H Caren Griffins, MD   3 mL at 07/22/14 1118    Lab Results:  Results for orders placed during the hospital encounter of 07/18/14 (from the past 48 hour(s))  COMPREHENSIVE METABOLIC PANEL     Status: Abnormal   Collection Time    07/21/14  4:01 AM      Result Value Ref Range   Sodium 142  137 - 147 mEq/L   Potassium 3.6 (*) 3.7 - 5.3 mEq/L   Chloride 103  96 - 112 mEq/L   CO2 25  19 - 32 mEq/L   Glucose, Bld 109 (*) 70 - 99 mg/dL   BUN 4 (*) 6 - 23 mg/dL   Creatinine, Ser 0.92  0.50 - 1.35 mg/dL   Calcium 9.0  8.4 - 10.5 mg/dL   Total Protein 7.1  6.0 - 8.3 g/dL   Albumin 3.5  3.5 - 5.2 g/dL   AST 80 (*) 0 - 37 U/L   ALT 44  0 - 53 U/L   Alkaline Phosphatase 71  39 - 117 U/L   Total Bilirubin 1.0  0.3 - 1.2 mg/dL   GFR calc non Af Amer >90  >90 mL/min   GFR calc Af Amer >90  >90 mL/min   Comment: (NOTE)     The eGFR has been calculated using the CKD EPI equation.     This calculation has not been validated in all clinical situations.     eGFR's persistently <90 mL/min signify possible Chronic Kidney     Disease.   Anion gap 14  5 - 15  CBC     Status: None   Collection Time    07/21/14  4:01 AM      Result Value Ref Range   WBC 6.5  4.0 - 10.5 K/uL   RBC 5.05  4.22 - 5.81 MIL/uL   Hemoglobin 13.9  13.0 - 17.0 g/dL   HCT 41.5  39.0 - 52.0 %   MCV 82.2  78.0 - 100.0 fL   MCH 27.5  26.0 - 34.0 pg   MCHC 33.5  30.0 - 36.0 g/dL   RDW 13.5  11.5 - 15.5 %   Platelets 262  150 - 400 K/uL  CK     Status: Abnormal   Collection Time    07/21/14  4:01 AM       Result Value Ref Range   Total CK 3947 (*) 7 - 232 U/L    Physical Findings: AIMS:  , ,  ,  ,    CIWA:    COWS:     Treatment Plan Summary: Daily  contact with patient to assess and evaluate symptoms and progress in treatment Medication management  Plan: Continue current medication management and supportive care Recommended no psychotropic medication at this time Followup with him as clinically required  Medical Decision Making Problem Points:  Established problem, worsening (2), New problem, with no additional work-up planned (3) and Review of psycho-social stressors (1) Data Points:  Review or order clinical lab tests (1) Review and summation of old records (2) Review of medication regiment & side effects (2) Review of new medications or change in dosage (2)  I certify that inpatient services furnished can reasonably be expected to improve the patient's condition.   Cordarrel Stiefel,JANARDHAHA R. 07/22/2014, 1:45 PM

## 2014-07-22 NOTE — Progress Notes (Signed)
NUTRITION FOLLOW UP  Intervention:   - Ensure Complete po BID, each supplement provides 350 kcal and 13 grams of protein - RD will continue to follow for nutrition care plan.  Nutrition Dx:   Inadequate oral intake related to decreased mental status and NPO as evidenced by observation; improving  Goal:   Pt to meet >/= 90% of their estimated nutrition needs; improving  Monitor:   Weight trend, po intake, acceptance of supplements, labs  Assessment:   Patient with a hx of depression and schizophrenia admitted after overdosing on 6 weeks of medications.   - Pt reported that his usual body weight is around 135 lbs. He says that his appetite is getting better. Pt ate bacon, eggs, and grits for breakfast. Nurse tech confirmed that he ate well this morning. He says that his weight fluctuates and he has never tried nutritional supplements before.   Labs: Na low K and BUN WNL Height: Ht Readings from Last 1 Encounters:  07/18/14 6\' 1"  (1.854 m)    Weight Status:   Wt Readings from Last 1 Encounters:  07/19/14 136 lb 3.9 oz (61.8 kg)    Re-estimated needs:  Kcal: 1850-2050 Protein: 80-90 g Fluid: 2.0 L/day  Skin: intact  Diet Order: General   Intake/Output Summary (Last 24 hours) at 07/22/14 1152 Last data filed at 07/22/14 1118  Gross per 24 hour  Intake 2384.75 ml  Output   1925 ml  Net 459.75 ml    Last BM: prior to admission   Labs:   Recent Labs Lab 07/18/14 1458 07/18/14 1514 07/19/14 0335 07/20/14 0341 07/21/14 0401  NA 140  --  136* 138 142  K 4.8  --  4.1 4.0 3.6*  CL 103  --  101 101 103  CO2 23  --  23 27 25   BUN 12  --  8 6 4*  CREATININE 0.94  --  0.92 0.87 0.92  CALCIUM 9.3  --  9.0 9.0 9.0  MG  --  2.5 2.1 2.1  --   PHOS  --   --  3.4 3.8  --   GLUCOSE 64*  --  94 93 109*    CBG (last 3)  No results found for this basename: GLUCAP,  in the last 72 hours  Scheduled Meds: . antiseptic oral rinse  7 mL Mouth Rinse q12n4p  .  chlorhexidine  15 mL Mouth Rinse BID  . enoxaparin (LOVENOX) injection  40 mg Subcutaneous Q24H  . nicotine  14 mg Transdermal Daily  . sodium chloride  3 mL Intravenous Q12H    Continuous Infusions: . dextrose 5 % and 0.45% NaCl 125 mL/hr at 07/22/14 0533    Patrick Wong RD, LDN

## 2014-07-22 NOTE — Plan of Care (Signed)
Problem: Consults Goal: General Medical Patient Education See Patient Education Module for specific education.  Outcome: Completed/Met Date Met:  07/22/14 Given on admission to mom & girlfriend

## 2014-07-22 NOTE — Progress Notes (Addendum)
Patrick LarkLaquan L Wong VHQ:[id number] DOB: 1991/08/28 DOA: 07/18/2014 PCP: No primary provider on file.  HPI: 23 y.o. male has a past medical history significant for depression, schizophrenia not currently on any medication is being brought by his girlfriend after being found down. He apparently took 6 weeks worth of Benztropine 1 mg BID, Venlafaxine ER 150 mg BID, Haldol 10 mg QHS, Seroquel 200 mg QHS, Mirtazapine 15, 1/2 tablet QHS and Haldol 2 mg QHS.  Psychiatry and poison control consulted.  Poison control signed off 10/14  Subjective/ 24 H Interval events  much more alert today - hearing voices, male and femalesstill.  States contwent is gibberish -.Doesn't elucidate further - denies depression or SI    Overdose with 5 different agents  - continue supportive care, he is doing better this morning, transfer to telemetry today - psychiatry consulted this morning - neuro checks - Ativan PRN for agitation/seizures  Hypoglycemia - D5 1/2 NS saline locked 10/15 - continue reg diet Depression - psych consulted - ask for sitter when needed -Updated grandmother  History of schizophrenia  - psych consult -needs Placement -currently medically cleared from IM standpoint Rhabdomyolysis -monitor CK, better, due to immobilization and drug induced agitation -passing urine well -stop checking CK's 10/15 Right shoulder pain  - patient found down, unclear if fall or not, xray sub-optimal - patient has FROM to manual muscle testing -no further work-up needed, despite rpt shoulder xray 10/15   DVT Prophylaxis: Lovenox  Code Status: Full Family Communication: discussed with grandmother at bedside Disposition Plan: inpatient, telemetry transfer today  Consultants:  None   Procedures:  None    Antibiotics None   Studies  Filed Vitals:   07/21/14 1325 07/21/14 2136 07/22/14 0556 07/22/14 1407  BP: 130/71 128/65 109/58 119/61  Pulse: 76 54 57 61  Temp: 98.2 F (36.8 C) 98.1  F (36.7 C) 98.2 F (36.8 C) 98.1 F (36.7 C)  TempSrc: Oral Oral Oral Oral  Resp: 20 18 18 18   Height:      Weight:      SpO2: 100% 100% 99% 100%    Intake/Output Summary (Last 24 hours) at 07/22/14 1513 Last data filed at 07/22/14 1118  Gross per 24 hour  Intake 1684.75 ml  Output   1225 ml  Net 459.75 ml   Filed Weights   07/18/14 1800 07/19/14 0745  Weight: 62.3 kg (137 lb 5.6 oz) 61.8 kg (136 lb 3.9 oz)    Exam:  General:  No apparent distress, calm this morning, following commands, still appears to have hallucinations  Cardiovascular: RRR  Respiratory: clear on anterior auscultation  Abdomen: soft  MSK-neer and hawkin's tests neg.  No palpable dformity.  Good extension and flexion both shoulders.  NO pain in any provocative position  Flat affect.  Mumbling speech.  Power intact    Data Reviewed: Basic Metabolic Panel:  Recent Labs Lab 07/18/14 1458 07/18/14 1514 07/19/14 0335 07/20/14 0341 07/21/14 0401  NA 140  --  136* 138 142  K 4.8  --  4.1 4.0 3.6*  CL 103  --  101 101 103  CO2 23  --  23 27 25   GLUCOSE 64*  --  94 93 109*  BUN 12  --  8 6 4*  CREATININE 0.94  --  0.92 0.87 0.92  CALCIUM 9.3  --  9.0 9.0 9.0  MG  --  2.5 2.1 2.1  --   PHOS  --   --  3.4 3.8  --    Liver Function Tests:  Recent Labs Lab 07/18/14 1458 07/19/14 0335 07/20/14 0341 07/21/14 0401  AST 27 70* 97* 80*  ALT 10 14 31  44  ALKPHOS 83 78 70 71  BILITOT 0.5 1.1 1.0 1.0  PROT 7.9 7.1 6.9 7.1  ALBUMIN 4.6 4.0 3.6 3.5   CBC:  Recent Labs Lab 07/18/14 1458 07/19/14 0335 07/20/14 0341 07/21/14 0401  WBC 11.6* 11.1* 7.7 6.5  NEUTROABS 9.7* 8.3*  --   --   HGB 15.0 13.6 13.9 13.9  HCT 42.5 40.1 41.8 41.5  MCV 80.6 81.8 83.8 82.2  PLT 264 226 233 262   Cardiac Enzymes:  Recent Labs Lab 07/18/14 1514 07/19/14 0335 07/19/14 1656 07/20/14 0341 07/21/14 0401  CKTOTAL 1114* 4846* 6336* 4907* 3947*   CBG:  Recent Labs Lab 07/18/14 1740  07/19/14 0149  GLUCAP 121* 99    Recent Results (from the past 240 hour(s))  MRSA PCR SCREENING     Status: None   Collection Time    07/18/14  5:54 PM      Result Value Ref Range Status   MRSA by PCR NEGATIVE  NEGATIVE Final   Comment:            The GeneXpert MRSA Assay (FDA     approved for NASAL specimens     only), is one component of a     comprehensive MRSA colonization     surveillance program. It is not     intended to diagnose MRSA     infection nor to guide or     monitor treatment for     MRSA infections.     Studies: Dg Chest Portable 1 View 07/18/2014 Posterior fusion from T5-L1. Findings stable since prior thoracic spine series. Lungs are clear. Heart is normal size. No effusions or acute bony abnormality. CT head 10/12 Technically challenging examination without acute abnormality  Scheduled Meds: . antiseptic oral rinse  7 mL Mouth Rinse q12n4p  . chlorhexidine  15 mL Mouth Rinse BID  . enoxaparin (LOVENOX) injection  40 mg Subcutaneous Q24H  . feeding supplement (ENSURE COMPLETE)  237 mL Oral TID BM  . nicotine  14 mg Transdermal Daily  . sodium chloride  3 mL Intravenous Q12H   Continuous Infusions: . dextrose 5 % and 0.45% NaCl 125 mL/hr at 07/22/14 0533    Active Problems:   Overdose   Depression   Prolonged QT interval   Leukocytosis   Rhabdomyolysis   Serotonin syndrome   Anticholinergic syndrome   History of schizophrenia   Time spent: 2315  Pleas KochJai Brysten Reister, MD [facility] [phone]) [phone]

## 2014-07-23 ENCOUNTER — Inpatient Hospital Stay (HOSPITAL_COMMUNITY)
Admission: AD | Admit: 2014-07-23 | Discharge: 2014-07-28 | DRG: 885 | Disposition: A | Discharge status: Home / Self Care | Payer: Federal, State, Local not specified - Other | Source: Intra-hospital | Attending: Psychiatry | Admitting: Psychiatry

## 2014-07-23 ENCOUNTER — Encounter (HOSPITAL_COMMUNITY): Payer: Self-pay | Admitting: Behavioral Health

## 2014-07-23 DIAGNOSIS — Z818 Family history of other mental and behavioral disorders: Secondary | ICD-10-CM | POA: Diagnosis not present

## 2014-07-23 DIAGNOSIS — F1220 Cannabis dependence, uncomplicated: Secondary | ICD-10-CM

## 2014-07-23 DIAGNOSIS — F17210 Nicotine dependence, cigarettes, uncomplicated: Secondary | ICD-10-CM | POA: Diagnosis present

## 2014-07-23 DIAGNOSIS — G2579 Other drug induced movement disorders: Secondary | ICD-10-CM

## 2014-07-23 DIAGNOSIS — F203 Undifferentiated schizophrenia: Secondary | ICD-10-CM

## 2014-07-23 DIAGNOSIS — Z609 Problem related to social environment, unspecified: Secondary | ICD-10-CM | POA: Diagnosis present

## 2014-07-23 DIAGNOSIS — F419 Anxiety disorder, unspecified: Secondary | ICD-10-CM | POA: Diagnosis present

## 2014-07-23 DIAGNOSIS — F1290 Cannabis use, unspecified, uncomplicated: Secondary | ICD-10-CM | POA: Diagnosis present

## 2014-07-23 DIAGNOSIS — F32A Depression, unspecified: Secondary | ICD-10-CM

## 2014-07-23 DIAGNOSIS — F259 Schizoaffective disorder, unspecified: Secondary | ICD-10-CM

## 2014-07-23 DIAGNOSIS — Z8659 Personal history of other mental and behavioral disorders: Secondary | ICD-10-CM

## 2014-07-23 DIAGNOSIS — T50902D Poisoning by unspecified drugs, medicaments and biological substances, intentional self-harm, subsequent encounter: Secondary | ICD-10-CM

## 2014-07-23 DIAGNOSIS — F209 Schizophrenia, unspecified: Principal | ICD-10-CM | POA: Diagnosis present

## 2014-07-23 DIAGNOSIS — F329 Major depressive disorder, single episode, unspecified: Secondary | ICD-10-CM | POA: Diagnosis present

## 2014-07-23 DIAGNOSIS — R45851 Suicidal ideations: Secondary | ICD-10-CM | POA: Diagnosis present

## 2014-07-23 DIAGNOSIS — F251 Schizoaffective disorder, depressive type: Secondary | ICD-10-CM

## 2014-07-23 DIAGNOSIS — R748 Abnormal levels of other serum enzymes: Secondary | ICD-10-CM

## 2014-07-23 DIAGNOSIS — M6282 Rhabdomyolysis: Secondary | ICD-10-CM

## 2014-07-23 LAB — CK: CK TOTAL: 1713 U/L — AB (ref 7–232)

## 2014-07-23 MED ORDER — MAGNESIUM HYDROXIDE 400 MG/5ML PO SUSP: 30.0000 mL | Freq: Every day | ORAL | Status: DC | PRN

## 2014-07-23 MED ORDER — NICOTINE 14 MG/24HR TD PT24: 14.0000 mg | MEDICATED_PATCH | Freq: Every day | TRANSDERMAL | Status: DC

## 2014-07-23 MED ORDER — ACETAMINOPHEN 325 MG PO TABS: 650.0000 mg | ORAL_TABLET | Freq: Four times a day (QID) | ORAL | Status: DC | PRN

## 2014-07-23 MED ORDER — TRAZODONE HCL 50 MG PO TABS
50.0000 mg | ORAL_TABLET | Freq: Every evening | ORAL | Status: DC | PRN
  Administered 2014-07-26 – 2014-07-27 (×2): 50 mg via ORAL
  Filled 2014-07-23: qty 28
  Filled 2014-07-23 (×2): qty 1

## 2014-07-23 MED ORDER — LORAZEPAM 2 MG/ML IJ SOLN: 1.0000 mg | INTRAMUSCULAR | Status: DC | PRN

## 2014-07-23 MED ORDER — ENSURE COMPLETE PO LIQD: 237.0000 mL | Freq: Three times a day (TID) | ORAL | Status: DC

## 2014-07-23 MED ORDER — ALUM & MAG HYDROXIDE-SIMETH 200-200-20 MG/5ML PO SUSP: 30.0000 mL | ORAL | Status: DC | PRN
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
## 2014-07-23 NOTE — Discharge Summary (Signed)
Physician Discharge Summary  Patrick Wong ZOX:[id number] DOB: 1991/01/18 DOA: 07/18/2014  PCP: No primary provider on file.  Admit date: 07/18/2014 Discharge date: 07/23/2014  Time spent: 25 minutes  Recommendations for Outpatient Follow-up:  1. Patient to be monitored closely by behavioral health [facility] 2. Patient should follow up with primary care physician and optometrist for vision issues  Discharge Diagnoses:  Active Problems:   Overdose   Depression   Prolonged QT interval   Leukocytosis   Rhabdomyolysis   Serotonin syndrome   Anticholinergic syndrome   History of schizophrenia   Discharge Condition: Fair  Diet recommendation: Regular  Filed Weights   07/18/14 1800 07/19/14 0745  Weight: 62.3 kg (137 lb 5.6 oz) 61.8 kg (136 lb 3.9 oz)    History of present illness:  23 y.o. male has a past medical history significant for depression, schizophrenia not currently on any medication is being brought by his girlfriend after being found down. He apparently took 6 weeks worth of Benztropine 1 mg BID, Venlafaxine ER 150 mg BID, Haldol 10 mg QHS, Seroquel 200 mg QHS, Mirtazapine 15, 1/2 tablet QHS and Haldol 2 mg QHS. Psychiatry and poison control consulted. Poison control signed off 10/14 He was initially hypoglycemic and then started a regular diet and had sugars normalize and a safety suture was contracted for safety. Psychiatry consulted and the patient was felt to be appropriate for behavioral health. He had a concern for shoulder injury as he had been found laying down for a long period of time his CPK was elevated into the 4000 range initially likely secondary to either immobility or multiple anticholinergic medications and serotonin syndrome however this dropped to 1713 on discharge. On discharge he felt much better although it was still having thoughts of suicide vs. delusions and patient will need to be reassessed at the health for further  management   Discharge Exam: Filed Vitals:   07/23/14 1414  BP: 113/71  Pulse: 69  Temp: 98.3 F (36.8 C)  Resp: 18    General: Alert pleasant Cardiovascular: S1-S2 no murmur rub or gallop Respiratory: Clinically clear  Discharge Instructions You were cared for by a hospitalist during your hospital stay. If you have any questions about your discharge medications or the care you received while you were in the hospital after you are discharged, you can call the unit and asked to speak with the hospitalist on call if the hospitalist that took care of you is not available. Once you are discharged, your primary care physician will handle any further medical issues. Please note that NO REFILLS for any discharge medications will be authorized once you are discharged, as it is imperative that you return to your primary care physician (or establish a relationship with a primary care physician if you do not have one) for your aftercare needs so that they can reassess your need for medications and monitor your lab values.  Discharge Instructions   Diet - low sodium heart healthy    Complete by:  As directed      Discharge instructions    Complete by:  As directed   Patient will need behavioral health services for schizophrenia/depression and will be transferred to [facility]. He was given a prescription for IV Ativan prior to transfer--please keep an IV on discharge     Increase activity slowly    Complete by:  As directed      Maintain IV access    Complete by:  As directed           Current Discharge Medication List    START taking these medications   Details  feeding supplement, ENSURE COMPLETE, (ENSURE COMPLETE) LIQD Take 237 mLs by mouth 3 (three) times daily between meals.    LORazepam (ATIVAN) 2 MG/ML injection Inject 0.5 mLs (1 mg total) into the vein every 4 (four) hours as needed for anxiety or seizure. Qty: 1 mL, Refills: 0    nicotine (NICODERM CQ - DOSED IN MG/24 HOURS) 14  mg/24hr patch Place 1 patch (14 mg total) onto the skin daily. Qty: 28 patch, Refills: 0       No Known Allergies    The results of significant diagnostics from this hospitalization (including imaging, microbiology, ancillary and laboratory) are listed below for reference.    Significant Diagnostic Studies: Dg Shoulder Right  07/22/2014   CLINICAL DATA:  History dislocations.  Pain.  Initial evaluation.  EXAM: RIGHT SHOULDER - 2+ VIEW  COMPARISON:  07/20/2014.  FINDINGS: Again noted is widening of the [facility] joint suggesting possibility of separation. Also again noted is mild deformity of the anterior aspect of the glenoid. Subtle inferior glenoid fracture cannot be excluded. No evidence of dislocation. Prior spinal fusion.  IMPRESSION: 1. Right shoulder unchanged from prior exam with possible right AC separation. Imaging with and without weights can be obtained.  2. Subtle deformity inferior aspect of the glenoid. A subtle fracture or degenerative change could present in this fashion. This is unchanged.   Electronically Signed   By: Maisie Fushomas  Register   On: 07/22/2014 10:09   Ct Head Wo Contrast  07/19/2014   CLINICAL DATA:  History of depression and schizophrenia. Found lying face down and overdose.  EXAM: CT HEAD WITHOUT CONTRAST  TECHNIQUE: Contiguous axial images were obtained from the base of the skull through the vertex without contrast.  COMPARISON:  None  FINDINGS: This study is technically challenging due to motion artifact and positioning of the head during the examination. No evidence for acute hemorrhage, mass lesion, midline shift, hydrocephalus or large infarct. Paranasal sinuses are aerated without significant disease. No evidence for a fracture.  IMPRESSION: Technically challenging examination without acute abnormality.   Electronically Signed   By: Richarda OverlieAdam  Henn M.D.   On: 07/19/2014 09:13   Dg Chest Portable 1 View  07/18/2014   CLINICAL DATA:  Drug overdose. History of scoliosis.  Depression, schizophrenia.  EXAM: PORTABLE CHEST - 1 VIEW  COMPARISON:  Thoracic spine series 08/11/2010.  FINDINGS: Posterior fusion from T5-L1. Findings stable since prior thoracic spine series. Lungs are clear. Heart is normal size. No effusions or acute bony abnormality.  IMPRESSION: No active cardiopulmonary disease.   Electronically Signed   By: Charlett NoseKevin  Dover M.D.   On: 07/18/2014 15:23   Dg Shoulder Right Port  07/20/2014   CLINICAL DATA:  Fall.  Right shoulder pain.  EXAM: PORTABLE RIGHT SHOULDER - 2+ VIEW  COMPARISON:  Chest x-ray 07/18/2014  FINDINGS: Views are nonstandard because of patient's level of cooperation. There is widening of the acromioclavicular joint raising the question of traumatic acute AC separation. There is irregularity of the inferior aspect of the glenoid fossa which warrants further evaluation. The clavicle appears intact. Proximal humerus appears intact.  IMPRESSION: 1. Possible acute AC separation. 2. Cannot exclude a fracture of the inferior glenoid. 3. Full shoulder series is recommended to be performed in the department when the patient is able.   Electronically Signed   By: Waynetta SandyBeth  Manson PasseyBrown M.D.   On: 07/20/2014 10:06    Microbiology: Recent Results (from the past 240 hour(s))  MRSA PCR SCREENING     Status: None   Collection Time    07/18/14  5:54 PM      Result Value Ref Range Status   MRSA by PCR NEGATIVE  NEGATIVE Final   Comment:            The GeneXpert MRSA Assay (FDA     approved for NASAL specimens     only), is one component of a     comprehensive MRSA colonization     surveillance program. It is not     intended to diagnose MRSA     infection nor to guide or     monitor treatment for     MRSA infections.     Labs: Basic Metabolic Panel:  Recent Labs Lab 07/18/14 1458 07/18/14 1514 07/19/14 0335 07/20/14 0341 07/21/14 0401  NA 140  --  136* 138 142  K 4.8  --  4.1 4.0 3.6*  CL 103  --  101 101 103  CO2 23  --  23 27 25   GLUCOSE 64*  --   94 93 109*  BUN 12  --  8 6 4*  CREATININE 0.94  --  0.92 0.87 0.92  CALCIUM 9.3  --  9.0 9.0 9.0  MG  --  2.5 2.1 2.1  --   PHOS  --   --  3.4 3.8  --    Liver Function Tests:  Recent Labs Lab 07/18/14 1458 07/19/14 0335 07/20/14 0341 07/21/14 0401  AST 27 70* 97* 80*  ALT 10 14 31  44  ALKPHOS 83 78 70 71  BILITOT 0.5 1.1 1.0 1.0  PROT 7.9 7.1 6.9 7.1  ALBUMIN 4.6 4.0 3.6 3.5   No results found for this basename: LIPASE, AMYLASE,  in the last 168 hours No results found for this basename: AMMONIA,  in the last 168 hours CBC:  Recent Labs Lab 07/18/14 1458 07/19/14 0335 07/20/14 0341 07/21/14 0401  WBC 11.6* 11.1* 7.7 6.5  NEUTROABS 9.7* 8.3*  --   --   HGB 15.0 13.6 13.9 13.9  HCT 42.5 40.1 41.8 41.5  MCV 80.6 81.8 83.8 82.2  PLT 264 226 233 262   Cardiac Enzymes:  Recent Labs Lab 07/19/14 0335 07/19/14 1656 07/20/14 0341 07/21/14 0401 07/23/14 0500  CKTOTAL 4846* 6336* 4907* 3947* 1713*   BNP: BNP (last 3 results) No results found for this basename: PROBNP,  in the last 8760 hours CBG:  Recent Labs Lab 07/18/14 1740 07/19/14 0149  GLUCAP 121* 99       Signed:  Musa Rewerts, JAI-GURMUKH  [facility] 07/23/2014, 5:18 PM

## 2014-07-23 NOTE — Progress Notes (Signed)
Pt has been medically cleared and meets inpatient criteria for psychiatric treatment. [facility] [address] [facility]. SW spoke with pt who reported the last time he experienced auditory hallucinations was 07/21/14. SW informed pt that we are seeking inpt placement. Pt is in agreement with plan. Pt.'s girlfriend at bedside.  Derrell Lollingoris Bernisha Verma, MSW Clinical Social Worker [phone]

## 2014-07-23 NOTE — Progress Notes (Signed)
Patient complaining of "blurry" vision.  Patient tells Clinical research associatewriter " my blurry vision is new, I have not had this before."  Dr. Mahala MenghiniSamtani notified, no new orders received. Will continue to monitor patient.

## 2014-07-23 NOTE — Tx Team (Signed)
Initial Interdisciplinary Treatment Plan   PATIENT STRESSORS: Marital or family conflict Medication change or noncompliance   PROBLEM LIST: Problem List/Patient Goals Date to be addressed Date deferred Reason deferred Estimated date of resolution  Suicidal Ideation 07/23/14     Psychosis 07/23/14     Depression 07/23/14                                          DISCHARGE CRITERIA:  Ability to meet basic life and health needs Improved stabilization in mood, thinking, and/or behavior Verbal commitment to aftercare and medication compliance  PRELIMINARY DISCHARGE PLAN: Outpatient therapy Return to previous living arrangement  PATIENT/FAMIILY INVOLVEMENT: This treatment plan has been presented to and reviewed with the patient, Patrick Wong, and/or family member.  The patient and family have been given the opportunity to ask questions and make suggestions.  Leda QuailSmith, Christy Friede T 07/23/2014, 11:08 PM

## 2014-07-23 NOTE — Progress Notes (Signed)
[facility], accepting physician Dr. Elsie SaasJonnalagadda. Attending RN and pt./pt.'s family made aware. Pt to be transported after 7:00 pm.  Derrell Lollingoris Minnette Merida, MSW  Social Worker [phone]

## 2014-07-23 NOTE — Treatment Plan (Signed)
[facility].  Please call [phone] for details.  Attempted to call SW, but had to leave a message.

## 2014-07-24 DIAGNOSIS — T50902D Poisoning by unspecified drugs, medicaments and biological substances, intentional self-harm, subsequent encounter: Secondary | ICD-10-CM

## 2014-07-24 DIAGNOSIS — R45851 Suicidal ideations: Secondary | ICD-10-CM

## 2014-07-24 DIAGNOSIS — Z8659 Personal history of other mental and behavioral disorders: Secondary | ICD-10-CM

## 2014-07-24 MED ORDER — ARIPIPRAZOLE 5 MG PO TABS
5.0000 mg | ORAL_TABLET | Freq: Every day | ORAL | Status: DC
  Administered 2014-07-24 – 2014-07-25 (×2): 5 mg via ORAL
  Filled 2014-07-24 (×5): qty 1

## 2014-07-24 MED ORDER — FLUOXETINE HCL 20 MG PO CAPS
20.0000 mg | ORAL_CAPSULE | Freq: Every day | ORAL | Status: DC
  Administered 2014-07-25 – 2014-07-26 (×2): 20 mg via ORAL
  Filled 2014-07-24 (×6): qty 1
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
## 2014-07-24 NOTE — Progress Notes (Signed)
Psychoeducational Group Note  Date:  07/24/2014 Time:  2128  Group Topic/Focus:  Wrap-Up Group:   The focus of this group is to help patients review their daily goal of treatment and discuss progress on daily workbooks.  Participation Level: Did Not Attend  Participation Quality:  Not Applicable  Affect:  Not Applicable  Cognitive:  Not Applicable  Insight:  Not Applicable  Engagement in Group: Not Applicable  Additional Comments:  The patient did not attend group since he remained in his bedroom.   Hazle CocaGOODMAN, Sheridyn Canino S 07/24/2014, 9:28 PM
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
## 2014-07-24 NOTE — Progress Notes (Signed)
Patient ID: Patrick Wong, male   DOB: 1991/07/14, 23 y.o.   MRN: [id number] Writer spoke with patient in attempt to complete PSA. Patient declined request reporting he was too tired to answer questions which was evidenced by patient's closed eyes and MHT report that patient had not been seen awake today.  Carney Bernatherine C Harrill, LCSW

## 2014-07-24 NOTE — [facility] Suicide Risk Assessment (Signed)
Suicide Risk Assessment  Admission Assessment     Nursing information obtained from:    Demographic factors:    Current Mental Status:    Loss Factors:    Historical Factors:    Risk Reduction Factors:    Total Time spent with patient: 30 minutes  CLINICAL FACTORS:   Depression:   Anhedonia Hopelessness Impulsivity Insomnia Recent sense of peace/wellbeing Severe Alcohol/Substance Abuse/Dependencies Unstable or Poor Therapeutic Relationship  COGNITIVE FEATURES THAT CONTRIBUTE TO RISK:  Closed-mindedness Loss of executive function Polarized thinking Thought constriction (tunnel vision)    SUICIDE RISK:   Severe:  Frequent, intense, and enduring suicidal ideation, specific plan, no subjective intent, but some objective markers of intent (i.e., choice of lethal method), the method is accessible, some limited preparatory behavior, evidence of impaired self-control, severe dysphoria/symptomatology, multiple risk factors present, and few if any protective factors, particularly a lack of social support.  PLAN OF CARE: admitted with depression and status post suicidal attempt with multiple medication with intent to kill himself. He needs crisis stabilization, safety monitoring and medication management for depression and may be substance abuse.   I certify that inpatient services furnished can reasonably be expected to improve the patient's condition.  Teryl Gubler,JANARDHAHA R. 07/24/2014, 2:09 PM

## 2014-07-24 NOTE — [facility] Group Notes (Signed)
[facility] Group Notes: (Clinical Social Work)   07/24/2014      Type of Therapy:  Group Therapy   Participation Level:  Did Not Attend - refused to get out of bed   Patrick MantleMareida Grossman-Orr, LCSW 07/24/2014, 1:05 PM

## 2014-07-24 NOTE — Progress Notes (Signed)
Patient ID: Patrick Wong, male   DOB: 1990/10/14, 23 y.o.   MRN: [id number]   D: Pt has been very flat and depressed on the unit today, he has been very isolative and has been in the bed all day. Pt refused all medications he reported that he was good and that he did not need anything. Pt did not attend any groups and has not engaged in treatment. Pt reported being negative SI/HI, no AH/VH noted. A: 15 min checks continued for patient safety. R: Pt safety maintained.

## 2014-07-25 DIAGNOSIS — F259 Schizoaffective disorder, unspecified: Secondary | ICD-10-CM

## 2014-07-25 LAB — LIPID PANEL
CHOL/HDL RATIO: 3.7 ratio
Cholesterol: 112 mg/dL (ref 0–200)
HDL: 30 mg/dL — AB (ref >39)
LDL Cholesterol: 63 mg/dL (ref 0–99)
Triglycerides: 93 mg/dL (ref <150)
VLDL: 19 mg/dL (ref 0–40)

## 2014-07-25 LAB — TSH: TSH: 1.34 u[IU]/mL (ref 0.350–4.500)

## 2014-07-25 LAB — T4, FREE: FREE T4: 0.92 ng/dL (ref 0.80–1.80)

## 2014-07-25 MED ORDER — ENSURE COMPLETE PO LIQD
237.0000 mL | Freq: Three times a day (TID) | ORAL | Status: DC
  Administered 2014-07-25 – 2014-07-28 (×9): 237 mL via ORAL

## 2014-07-25 NOTE — Progress Notes (Signed)
NUTRITION ASSESSMENT  Pt identified as at risk on the Malnutrition Screen Tool  INTERVENTION: 1. Educated patient on the importance of nutrition and encouraged intake of food and beverages. 2. Discussed weight goals. 3. Supplements: Ensure Complete po TID, each supplement provides 350 kcal and 13 grams of protein   NUTRITION DIAGNOSIS: Unintentional weight loss related to sub-optimal intake as evidenced by pt report.   Goal: Pt to meet >/= 90% of their estimated nutrition needs.  Monitor:  PO intake  Assessment:  [facility]. His po intake has improved since that time when he was eating poorly and had N/V. He reports that he ate a good breakfast and lunch today and says that his appetite is normal. Pt is underweight. He was drinking Ensure Complete TID and agreed to continue to regimen [facility] in [facility]. Pt's usual body weight is around 135 lbs.   23 y.o. male  Height: Ht Readings from Last 1 Encounters:  07/23/14 6\' 3"  (1.905 m)    Weight: Wt Readings from Last 1 Encounters:  07/23/14 128 lb (58.06 kg)    Weight Hx: Wt Readings from Last 10 Encounters:  07/23/14 128 lb (58.06 kg)  07/19/14 136 lb 3.9 oz (61.8 kg)    BMI:  Body mass index is 16 kg/(m^2). Pt meets criteria for underweight based on current BMI.  Estimated Nutritional Needs: Kcal: 25-30 kcal/kg Protein: > 1 gram protein/kg Fluid: 1 ml/kcal  Diet Order: General Pt is also offered choice of unit snacks mid-morning and mid-afternoon.  Pt is eating as desired.   Lab results and medications reviewed.   Emmaline KluverHaley Sayge Brienza RD, LDN

## 2014-07-25 NOTE — Progress Notes (Addendum)
Writer observed patient lying in his bed awake when Clinical research associatewriter entered his room. Patient voiced no complaints and writer informed him of medications scheduled. He is agreeable to taking his medications. Patient denies si/hi/a/v hallucinations. Support and encouragement given, safety maintained on unit with 15 min checks. Patient inquired as to how long he will be here. Writer encouraged him to speak with his doctor concerning this matter.

## 2014-07-25 NOTE — [facility] Group Notes (Signed)
[facility] Group Notes:  (Nursing/MHT/Case Management/Adjunct)  Date:  07/25/2014  Time:  0900  Type of Therapy:  self inventory review  Participation Level:  Did Not Attend  Participation Quality:  na  Affect:  na  Cognitive:  na  Insight:  None  Engagement in Group:  na  Modes of Intervention:  na  Summary of Progress/Problems:  [name], Patrick Wong 07/25/2014, 4:32 PM

## 2014-07-25 NOTE — [facility] Group Notes (Signed)
[facility] Group Notes:  (Clinical Social Work)  07/25/2014   11:15am-12:00pm  Summary of Progress/Problems:  The main focus of today's process group was to listen to a variety of genres of music and to identify that different types of music provoke different responses.  The patient then was able to identify personally what was soothing for them, as well as energizing.  At the beginning of group, the patient identified his overall mood as "great" although his affect was depressed.  He left after 2 songs, did not return.  Type of Therapy:  Music Therapy   Participation Level:  Active  Participation Quality:  Limited  Affect:  Blunted and Depressed  Cognitive:  Unable to assess  Insight:  Improving  Engagement in Therapy: Limited  Modes of Intervention:   Activity, Exploration  Ambrose MantleMareida Grossman-Orr, LCSW 07/25/2014, 12:30pm

## 2014-07-25 NOTE — Progress Notes (Signed)
Patient ID: Patrick Wong, male   DOB: 01-04-1991, 23 y.o.   MRN: [id number] He was in bed most of AM and did get up for noon meals. He has c/o RT shoulder joint pain because his joint will pop out of place and he puts it back in place.  He has not requested and prn medications today. He was up for shout intervals this afternoon and is in bed at this time. Self inventory at 13:30.  Depression 4, hopelessness 6, anxiety 4, denies withdrawals. Goal: to stay focused. BY By being more humble. He was asking when he would be discharged.

## 2014-07-25 NOTE — Plan of Care (Signed)
Problem: Diagnosis: Increased Risk For Suicide Attempt Goal: STG-Patient Will Attend All Groups On The Unit Outcome: Not Progressing Patient did not attend group this evening.     

## 2014-07-25 NOTE — Plan of Care (Signed)
Problem: Diagnosis: Increased Risk For Suicide Attempt Goal: STG-Patient Will Report Suicidal Feelings to Staff Outcome: Progressing Patient denies suicidal thoughts.      

## 2014-07-25 NOTE — [facility] Group Notes (Signed)
[facility] Group Notes:  (Nursing/MHT/Case Management/Adjunct)  Date:  07/25/2014  Time:  0930  Type of Therapy:  Psychoeducational Skills--healthy support systems   Participation Level:  Did Not Attend  Participation Quality:  na  Affect:  na  Cognitive:  na  Insight:  None  Engagement in Group:  na  Modes of Intervention:  na  Summary of Progress/Problems:  Patrick Wong, Patrick Wong 07/25/2014, 4:35 PM

## 2014-07-25 NOTE — Progress Notes (Signed)
[facility]  07/25/2014 3:06 PM Patrick Wong  MRN:  [id number] Subjective:   Patient states "I was hearing voices for a few months. I was upset by my cousin's death earlier this year. I'm not sure why I started hearing voices though. I got so upset that I took a handful of pills. Then I was told that I started tearing things up. That was the first time I have ever done that. I was wondering how long I will have to stay here. I have a job interview coming up tomorrow."  Objective:  Patient is assessed in his room. He appears very depressed and is observed sitting by the window. Patrick BullockLaquan reports that he is no longer hearing voices like he was prior to admission. He feels like the medications are helping alleviate his symptoms. Patient is compliant with medications. So far the patient is not reporting any adverse effects. Denies any suicidal thoughts and contracts for safety. Patient is fixated on discharge. He appears to have poor insight into the reasons for continued hospitalization.   Diagnosis:   DSM5:  Total Time spent with patient: 30 minutes   Schizophrenia Disorders:  Obsessive-Compulsive Disorders:  Trauma-Stressor Disorders:  Substance/Addictive Disorders:  Depressive Disorders: Major Depressive Disorder - Severe (296.23)  AXIS I: Schizoaffective Disorder  AXIS II: Deferred  AXIS III:  Past Medical History   Diagnosis  Date   .  Scoliosis    .  Depression    .  Schizophrenia     AXIS IV: other psychosocial or environmental problems, problems related to social environment and problems with primary support group  AXIS V: 41-50 Serious Symptoms   ADL's:  Intact  Sleep: Fair  Appetite:  Good  Suicidal Ideation:  Denies Homicidal Ideation:  Denies AEB (as evidenced by):  Psychiatric Specialty Exam: Physical Exam  Review of Systems  Constitutional: Negative.   HENT: Negative.   Eyes: Negative.   Respiratory: Negative.   Cardiovascular: Negative.    Gastrointestinal: Negative.   Genitourinary: Negative.   Musculoskeletal: Negative.   Skin: Negative.   Neurological: Negative.   Endo/Heme/Allergies: Negative.   Psychiatric/Behavioral: Positive for depression, suicidal ideas, hallucinations and substance abuse (UDS positive for marijuana. ).    Blood pressure 115/68, pulse 82, temperature 97.7 F (36.5 C), temperature source Oral, resp. rate 16, height 6\' 3"  (1.905 m), weight 58.06 kg (128 lb).Body mass index is 16 kg/(m^2).  General Appearance: Casual  Eye Contact::  Fair  Speech:  Clear and Coherent  Volume:  Decreased  Mood:  Dysphoric  Affect:  Restricted  Thought Process:  Goal Directed  Orientation:  Full (Time, Place, and Person)  Thought Content:  Hallucinations: Auditory  Suicidal Thoughts:  Yes.  with intent/plan  Homicidal Thoughts:  No  Memory:  Immediate;   Good Recent;   Fair Remote;   Good  Judgement:  Impaired  Insight:  Lacking  Psychomotor Activity:  Decreased  Concentration:  Fair  Recall:  F[employer]Fair  Fund of Knowledge:Fair  Language: Fair  Akathisia:  No  Handed:  Right  AIMS (if indicated):     Assets:  Communication Skills Desire for Improvement Financial Resources/Insurance Housing Intimacy Leisure Time Physical Health Resilience Social Support  Sleep:  Number of Hours: 6.75   Musculoskeletal: Strength & Muscle Tone: within normal limits Gait & Station: normal Patient leans: N/A  Current Medications: Current Facility-Administered Medications  Medication Dose Route Frequency Provider Last Rate Last Dose  . acetaminophen (TYLENOL) tablet 650 mg  650  mg Oral Q6H PRN Court Joy, PA-C      . alum & mag hydroxide-simeth (MAALOX/MYLANTA) 200-200-20 MG/5ML suspension 30 mL  30 mL Oral Q4H PRN Court Joy, PA-C      . ARIPiprazole (ABILIFY) tablet 5 mg  5 mg Oral QHS Nehemiah Settle, MD   5 mg at 07/24/14 2135  . feeding supplement (ENSURE COMPLETE) (ENSURE COMPLETE) liquid 237  mL  237 mL Oral TID BM Normand Sloop, RD   237 mL at 07/25/14 1418  . FLUoxetine (PROZAC) capsule 20 mg  20 mg Oral Daily Nehemiah Settle, MD   20 mg at 07/25/14 0742  . magnesium hydroxide (MILK OF MAGNESIA) suspension 30 mL  30 mL Oral Daily PRN Court Joy, PA-C      . traZODone (DESYREL) tablet 50 mg  50 mg Oral QHS PRN,MR X 1 Court Joy, PA-C        Lab Results:  Results for orders placed during the hospital encounter of 07/23/14 (from the past 48 hour(s))  LIPID PANEL     Status: Abnormal   Collection Time    07/25/14  6:47 AM      Result Value Ref Range   Cholesterol 112  0 - 200 mg/dL   Triglycerides 93  <161 mg/dL   HDL 30 (*) >09 mg/dL   Total CHOL/HDL Ratio 3.7     VLDL 19  0 - 40 mg/dL   LDL Cholesterol 63  0 - 99 mg/dL   Comment:            Total Cholesterol/HDL:CHD Risk     Coronary Heart Disease Risk Table                         Men   Women      1/2 Average Risk   3.4   3.3      Average Risk       5.0   4.4      2 X Average Risk   9.6   7.1      3 X Average Risk  23.4   11.0                Use the calculated Patient Ratio     above and the CHD Risk Table     to determine the patient's CHD Risk.                ATP III CLASSIFICATION (LDL):      <100     mg/dL   Optimal      604-540  mg/dL   Near or Above                        Optimal      130-159  mg/dL   Borderline      981-191  mg/dL   High      >478     mg/dL   Very High     Performed at [facility]  TSH     Status: None   Collection Time    07/25/14  6:47 AM      Result Value Ref Range   TSH 1.340  0.350 - 4.500 uIU/mL   Comment: Performed at [facility]    Physical Findings: AIMS:  , ,  ,  ,    CIWA:    COWS:  Treatment Plan Summary: Daily contact with patient to assess and evaluate symptoms and progress in treatment Medication management  Plan:  1. Continue crisis management and stabilization.  2. Medication management:  -Continue Prozac 20 mg  daily for depression. -Continue Abilify 5 mg daily for psychosis.  3. Encouraged patient to attend groups and participate in group counseling sessions and activities.  4. Discharge plan in progress.  5. Continue current treatment plan.  6. Address health issues: Vitals reviewed and stable.   Medical Decision Making Problem Points:  Established problem, stable/improving (1), Review of last therapy session (1) and Review of psycho-social stressors (1) Data Points:  Review or order clinical lab tests (1) Review of medication regiment & side effects (2) Review of new medications or change in dosage (2)  I certify that inpatient services furnished can reasonably be expected to improve the patient's condition.   DAVIS, LAURA NP-C 07/25/2014, 3:06 PM  Reviewed the information documented and agree with the treatment plan.  Anabela Crayton,JANARDHAHA R. 07/26/2014 8:14 AM

## 2014-07-25 NOTE — Progress Notes (Signed)
Writer has observed patient sitting up in the dayroom briefly this evening watching tv but no interaction with peers. Patient reports having had a good day and is still concerned about discharge on tomorrow. He reports that he has a job interview on tomorrow and an appointment with [facility]. Writer encouraged him to let his social worker be aware of his appointment with [facility] on tomorrow. He denies si/hi/a/v hallucinations. He did not attended group this evening. Support and encouragement given, safety maintained on unit with 15 min checks.

## 2014-07-25 NOTE — Plan of Care (Signed)
Problem: Ineffective individual coping Goal: STG: Patient will remain free from self harm Outcome: Progressing Patient has remained free from harm. Safety maintained with 15 min checks done.

## 2014-07-25 NOTE — Plan of Care (Signed)
Problem: Diagnosis: Increased Risk For Suicide Attempt Goal: STG-Patient Will Comply With Medication Regime Outcome: Progressing Patient is compliant with his scheduled medication.

## 2014-07-26 DIAGNOSIS — F1290 Cannabis use, unspecified, uncomplicated: Secondary | ICD-10-CM

## 2014-07-26 DIAGNOSIS — Z720 Tobacco use: Secondary | ICD-10-CM

## 2014-07-26 DIAGNOSIS — F203 Undifferentiated schizophrenia: Secondary | ICD-10-CM

## 2014-07-26 MED ORDER — ARIPIPRAZOLE 5 MG PO TABS
5.0000 mg | ORAL_TABLET | Freq: Every day | ORAL | Status: DC
  Administered 2014-07-26: 5 mg via ORAL
  Filled 2014-07-26 (×3): qty 1

## 2014-07-26 MED ORDER — FLUOXETINE HCL 20 MG PO CAPS
40.0000 mg | ORAL_CAPSULE | Freq: Every day | ORAL | Status: DC
Start: 2014-07-27 — End: 2014-07-28
  Administered 2014-07-27 – 2014-07-28 (×2): 40 mg via ORAL
  Filled 2014-07-26 (×3): qty 2
  Filled 2014-07-26: qty 28

## 2014-07-26 MED ORDER — ARIPIPRAZOLE 10 MG PO TABS: 10.0000 mg | ORAL_TABLET | Freq: Every day | ORAL | Status: DC

## 2014-07-26 NOTE — [facility] Group Notes (Signed)
[facility] LCSW Group Therapy  07/26/2014 1:46 PM  Type of Therapy:  Group Therapy  Participation Level:  Did Not Attend-pt left group within a few minutes. Stated that he would return. Did not return.  Smart, Jamilla Galli LCSWA 07/26/2014, 1:46 PM

## 2014-07-26 NOTE — Progress Notes (Signed)
[facility] MD Progress Note  07/26/2014 2:22 PM Patrick Wong  MRN:  [id number] Subjective: Patient states" I am doing OK ,I feel better now'. Objective: Patient seen and chart reviewed.Patient has a past medical history significant for depression, schizophrenia was not on any medications on admission. Patient was  brought by his girlfriend after being found down. He apparently took 6 weeks worth of Benztropine 1 mg BID, Venlafaxine ER 150 mg BID, Haldol 10 mg QHS, Seroquel 200 mg QHS, Mirtazapine 15, 1/2 tablet QHS and Haldol 2 mg QHS.Patient was seen by IM and was on medical floor. Patient had elevated CPK ,currently down trending at  1713.  likely secondary to either immobility or multiple anticholinergic medications and serotonin syndrome (per IM discharge summary).  Patient reports sleep as improved and appetite as fair. Patient denies SI/HI/AH/VH. Patient denies any side effects. Patient reports improvement of his sx after admission and is fixed on discharge.    Diagnosis:   DSM5: Primary Psychiatric Diagnosis: Schizophrenia,  multiple episodes ,currently in acute episode   Secondary Psychiatric Diagnosis: Cannabis use disorder,moderate Tobacco use disorder   Non Psychiatric Diagnosis: OD on medications (haldol,cogentin effexor,seroquel,remeron) Prolonged QT interval  Leukocytosis (resolved) Rhabdomyolysis (RESOLVING) Serotonin syndrome  Anticholinergic syndrome     Total Time spent with patient: 30 minutes    ADL's:  Intact  Sleep: Fair  Appetite:  Good   Psychiatric Specialty Exam: Physical Exam  Review of Systems  Constitutional: Negative.   HENT: Negative.   Eyes: Negative.   Respiratory: Negative.   Cardiovascular: Negative.   Gastrointestinal: Negative.   Genitourinary: Negative.   Musculoskeletal: Negative.   Skin: Negative.   Neurological: Negative.   Endo/Heme/Allergies: Negative.   Psychiatric/Behavioral: Positive for depression, suicidal ideas,  hallucinations and substance abuse (UDS positive for marijuana. ).    Blood pressure 129/80, pulse 93, temperature 97.5 F (36.4 C), temperature source Oral, resp. rate 18, height 6\' 3"  (1.905 m), weight 58.06 kg (128 lb).Body mass index is 16 kg/(m^2).  General Appearance: Casual  Eye Contact::  Fair  Speech:  Clear and Coherent  Volume:  Decreased  Mood:  Depressed  Affect:  Congruent  Thought Process:  Goal Directed  Orientation:  Full (Time, Place, and Person)  Thought Content:  WDL  Suicidal Thoughts:  No  Homicidal Thoughts:  No  Memory:  Immediate;   Good Recent;   Fair Remote;   Good  Judgement:  Impaired  Insight:  Lacking  Psychomotor Activity:  Decreased  Concentration:  Fair  Recall:  [employer] of Knowledge:Fair  Language: Fair  Akathisia:  No  Handed:  Right  AIMS (if indicated):     Assets:  Communication Skills Desire for Improvement Financial Resources/Insurance Housing Intimacy Leisure Time Physical Health Resilience Social Support  Sleep:  Number of Hours: 6.5   Musculoskeletal: Strength & Muscle Tone: within normal limits Gait & Station: normal Patient leans: N/A  Current Medications: Current Facility-Administered Medications  Medication Dose Route Frequency Provider Last Rate Last Dose  . acetaminophen (TYLENOL) tablet 650 mg  650 mg Oral Q6H PRN Court Joy, PA-C      . alum & mag hydroxide-simeth (MAALOX/MYLANTA) 200-200-20 MG/5ML suspension 30 mL  30 mL Oral Q4H PRN Court Joy, PA-C      . ARIPiprazole (ABILIFY) tablet 5 mg  5 mg Oral QHS Raylan Troiani, MD      . feeding supplement (ENSURE COMPLETE) (ENSURE COMPLETE) liquid 237 mL  237 mL Oral TID BM Biagio Borg  Lawrence, RD   237 mL at 07/26/14 [id number]  . [START ON 07/27/2014] FLUoxetine (PROZAC) capsule 40 mg  40 mg Oral Daily Berwyn Bigley, MD      . magnesium hydroxide (MILK OF MAGNESIA) suspension 30 mL  30 mL Oral Daily PRN Court Joyharles E Kober, PA-C      . traZODone (DESYREL) tablet  50 mg  50 mg Oral QHS PRN,MR X 1 Court Joyharles E Kober, PA-C        Lab Results:  Results for orders placed during the hospital encounter of 07/23/14 (from the past 48 hour(s))  LIPID PANEL     Status: Abnormal   Collection Time    07/25/14  6:47 AM      Result Value Ref Range   Cholesterol 112  0 - 200 mg/dL   Triglycerides 93  <295<150 mg/dL   HDL 30 (*) >62>39 mg/dL   Total CHOL/HDL Ratio 3.7     VLDL 19  0 - 40 mg/dL   LDL Cholesterol 63  0 - 99 mg/dL   Comment:            Total Cholesterol/HDL:CHD Risk     Coronary Heart Disease Risk Table                         Men   Women      1/2 Average Risk   3.4   3.3      Average Risk       5.0   4.4      2 X Average Risk   9.6   7.1      3 X Average Risk  23.4   11.0                Use the calculated Patient Ratio     above and the CHD Risk Table     to determine the patient's CHD Risk.                ATP III CLASSIFICATION (LDL):      <100     mg/dL   Optimal      [id number]  mg/dL   Near or Above                        Optimal      130-159  mg/dL   Borderline      [id number]  mg/dL   High      >295>190     mg/dL   Very High     Performed at [facility]  TSH     Status: None   Collection Time    07/25/14  6:47 AM      Result Value Ref Range   TSH 1.340  0.350 - 4.500 uIU/mL   Comment: Performed at [facility]  T4, FREE     Status: None   Collection Time    07/25/14  6:47 AM      Result Value Ref Range   Free T4 0.92  0.80 - 1.80 ng/dL   Comment: Performed at [facility]    Physical Findings: AIMS:  , ,  ,  ,    CIWA:    COWS:     Treatment Plan Summary: Daily contact with patient to assess and evaluate symptoms and progress in treatment Medication management  Plan:  1. Continue crisis management and stabilization.  2. Medication management:  -Increase Prozac  to 40 mg daily for depression. -Continue Abilify 5 mg daily for psychosis.Will continue to monitor CPK level. -Trazodone 50 mg po prn for sleep.   3. Encouraged patient to attend groups and participate in group counseling sessions and activities.  4. Discharge plan in progress. CSW will work on disposition. 5. Continue current treatment plan.  6. Address health issues: Vitals reviewed and stable.   Medical Decision Making Problem Points:  Established problem, stable/improving (1), Review of last therapy session (1) and Review of psycho-social stressors (1) Data Points:  Review or order clinical lab tests (1) Review of medication regiment & side effects (2) Review of new medications or change in dosage (2)  I certify that inpatient services furnished can reasonably be expected to improve the patient's condition.    Crispin Vogel MD 07/26/2014 2:22 PM

## 2014-07-26 NOTE — [facility] Group Notes (Signed)
[facility] L[facility] Dis[facility] Planning [facility] Note   07/26/2014 10:16 AM  Participation Quality:  Invited. Did not attend.   Smart, American FinancialHeather LCSWA

## 2014-07-26 NOTE — Progress Notes (Signed)
D: Patient in his room on approach.  Patient states she had a good day.  Patient states he is looking forward to going home tomorrow.  Patient states his goal is to wake up and go.  Patient states he has not been attending group and states he has not learned anything.  Patient denies SI/HI and denies AVH.   A: Staff to monitor Q 15 mins for safety.  Encouragement and support offered.  Scheduled medications administered per orders. R: Patient remains safe on the unit.  Patient did not attended group tonight.  Patient visible on the unit.  Patient taking administered medications.

## 2014-07-26 NOTE — Tx Team (Signed)
Interdisciplinary Treatment Plan Update (Adult)   Date: 07/26/2014   Time Reviewed: 10:38 AM  Progress in Treatment:  Attending groups: No.  Participating in groups:  No.  Taking medication as prescribed: Yes  Tolerating medication: Yes  Family/Significant othe contact made: Not yet. SPE required for pt. Collateral info would be helpful.  Patient understands diagnosis: Yes, AEB seeking treatment for AVH, SI with recent overdose, depression/mood stabilization, substance abuse (alcohol/marijuana), and for medication stabilization.  Discussing patient identified problems/goals with staff: Yes  Medical problems stabilized or resolved: Yes  Denies suicidal/homicidal ideation: Yes during self report with MD.  Patient has not harmed self or Others: Yes  New problem(s) identified:  Discharge Plan or Barriers: Pt not currently attending group. CSW assessing for appropriate referrals at this time.  Additional comments: Patrick Wong is a 23 years old young male [facility] for increased symptoms of depression, psychosis and status post suicidal attempt with multiple psychotropic medications.He has a past medical history significant for depression, schizophrenia not on any medication. Patient complained feeling irritability, agitation, mood swings and hitting and punching the walls in porch at home and AVH and his mother took him to [facility] about two weeks ago and given the diagnosis of schizophrenia. Patient stated that he cannot recall the incident of overdose with multiple medications. He has no previous suicidal attempt and psych hospitalization. He has been smoking tobacco,1 PPD, occasional cannabis and 3 beers a day. He stopped working in [employer] about a year ago due to lack of transportation. Reportedly he overdosed on benztropine 1 mg, Venlafaxine ER 150 mg, Haldol 10 mg, Seroquel 200 mg, mirtazapine 15 mg and haldol 2 mg prescribed for someone named Patrick CollieMona Wong (per booklet  notes). It appears that these booklets were full initially and now all medications (6 week supply) are missing. Few days prior to this incident at some point he appeared more withdrawn and was asking his girlfriend if she also is hearing "these voices". Patient reported his mother and father has unknown mental illness. Reason for Continuation of Hospitalization: Mood stabilization Medication management  Estimated length of stay: 5-7 days  For review of initial/current patient goals, please see plan of care.  Attendees:  Patient:    Family:    Physician: Dr. Elna BreslowEappen MD 07/26/2014. 10:37 AM   Nursing: Marcelina MorelChrista, Vivian, Marian, Brittany RN 07/26/2014 10:37 AM   Clinical Social Worker Shakeem Stern Smart, LCSWA  07/26/2014 10:37 AM   Other: Santa GeneraAnne Cunningham, LCSW 07/26/2014 10:37 AM   Other: Daryel Geraldodney North, LCSW 07/26/2014 10:37 AM   Other: Tomasita Morrowelora Sutton, Community Care Coordinator  07/26/2014 10:37 AM   Other:    Scribe for Treatment Team:  [employer] Sher[employer]Heather Smart LCSWA 07/26/2014 10:38 AM

## 2014-07-26 NOTE — Plan of Care (Signed)
Problem: Alteration in thought process Goal: STG-Patient is able to follow short directions Outcome: Completed/Met Date Met:  07/26/14 Patient can follow directions without issue.

## 2014-07-26 NOTE — Progress Notes (Signed)
Patient ID: Patrick Wong, male   DOB: September 30, 1991, 23 y.o.   MRN: [id number]  D: Pt. Denies SI/HI and A/V Hallucinations to this Clinical research associatewriter today. Patient does not report any pain or discomfort at this time. Patient rates depression, hopelessness, and anxiety at 0/10 for the day. Patient reports that his sleep in good, appetite is good, energy level is normal, and concentration level is good.   A: Support and encouragement provided to the patient to come to Clinical research associatewriter with questions or concerns. Scheduled medications administered to patient per physician's orders.  R: Patient is receptive and cooperative but forwards little and . Patient is rarely seen in the milieu although encouraged. Q15 minute checks are maintained for safety.

## 2014-07-27 LAB — CK TOTAL AND CKMB (NOT AT ARMC)
CK, MB: 3.9 ng/mL (ref 0.3–4.0)
RELATIVE INDEX: 0.8 (ref 0.0–2.5)
Total CK: 502 U/L — ABNORMAL HIGH (ref 7–232)

## 2014-07-27 MED ORDER — ARIPIPRAZOLE 10 MG PO TABS
10.0000 mg | ORAL_TABLET | Freq: Every day | ORAL | Status: DC
  Administered 2014-07-27: 10 mg via ORAL
  Filled 2014-07-27: qty 14
  Filled 2014-07-27 (×2): qty 1

## 2014-07-27 NOTE — Plan of Care (Signed)
Problem: Diagnosis: Increased Risk For Suicide Attempt Goal: STG-Patient Will Comply With Medication Regime Outcome: Completed/Met Date Met:  07/27/14 Patient has been taking all medication and states he will continue taking it so he can be discharged.

## 2014-07-27 NOTE — Progress Notes (Signed)
D: Patient has flat, depressed affect and mood. He reported on the self inventory sheet that his sleep and appetite are both good and energy level is normal. Patient is rating depression, feelings of hopelessness, and anxiety at "0". He has been lying in bed all day and no interaction with peers on the hall; not attending groups. Patient is compliant with medications.  A: Support and encouragement provided to patient; Clinical research associatewriter informed the patient that the MD would like to see him taking his medications and participating in groups before she would discharge him. Scheduled medications administered per MD orders. Maintain Q15 minute checks for safety.    R: Patient receptive. Denies SI/HI/AVH. Patient remains safe.

## 2014-07-27 NOTE — Progress Notes (Signed)
[facility]  07/27/2014 2:58 PM Patrick LarkLaquan L Wong  MRN:  [id number] Subjective: Patient states" I am doing OK ,I want to go home". Objective: Patient seen and chart reviewed. Patient was  brought by his girlfriend after being found down. He apparently took 6 weeks worth of Benztropine 1 mg BID, Venlafaxine ER 150 mg BID, Haldol 10 mg QHS, Seroquel 200 mg QHS, Mirtazapine 15, 1/2 tablet QHS and Haldol 2 mg QHS.Patient was seen by IM and was on medical floor. Patient had elevated CPK ,currently down trending . Patient today reports that he had a fight with his girlfriend and he decided to take the pills to get her attention and that he did not intent to kill self. Patient reports sleep as improved and appetite as fair.Patient reports mood as improved. Patient denies SI/HI/AH/VH. Patient denies any side effects. Per staff ,limited participation in groups, patient to be encouraged to participate .    Diagnosis:   DSM5: Primary Psychiatric Diagnosis: Schizophrenia,  multiple episodes ,currently in acute episode   Secondary Psychiatric Diagnosis: Cannabis use disorder,moderate Tobacco use disorder   Non Psychiatric Diagnosis: OD on medications (haldol,cogentin effexor,seroquel,remeron) Prolonged QT interval  Elevated CPK- currently downtrending Leukocytosis (resolved) Rhabdomyolysis (RESOLVING) Serotonin syndrome  Anticholinergic syndrome     Total Time spent with patient: 30 minutes    ADL's:  Intact  Sleep: Good  Appetite:  Good   Psychiatric Specialty Exam: Physical Exam  Review of Systems  Constitutional: Negative.   HENT: Negative.   Eyes: Negative.   Respiratory: Negative.   Cardiovascular: Negative.   Gastrointestinal: Negative.   Genitourinary: Negative.   Musculoskeletal: Negative.   Skin: Negative.   Neurological: Negative.   Endo/Heme/Allergies: Negative.   Psychiatric/Behavioral: Positive for depression, suicidal ideas, hallucinations and  substance abuse (UDS positive for marijuana. ).    Blood pressure 115/77, pulse 94, temperature 97.7 F (36.5 C), temperature source Oral, resp. rate 18, height 6\' 3"  (1.905 m), weight 58.06 kg (128 lb).Body mass index is 16 kg/(m^2).  General Appearance: Casual  Eye Contact::  Fair  Speech:  Clear and Coherent  Volume:  Decreased  Mood:  Anxious  Affect:  Congruent  Thought Process:  Goal Directed  Orientation:  Full (Time, Place, and Person)  Thought Content:  WDL  Suicidal Thoughts:  No  Homicidal Thoughts:  No  Memory:  Immediate;   Good Recent;   Fair Remote;   Good  Judgement:  Impaired  Insight:  Lacking  Psychomotor Activity:  Decreased  Concentration:  Fair  Recall:  [employer]  Fund of Knowledge:Fair  Language: Fair  Akathisia:  No  Handed:  Right  AIMS (if indicated):     Assets:  Communication Skills Desire for Improvement Financial Resources/Insurance Housing Intimacy Leisure Time Physical Health Resilience Social Support  Sleep:  Number of Hours: 6.75   Musculoskeletal: Strength & Muscle Tone: within normal limits Gait & Station: normal Patient leans: N/A  Current Medications: Current Facility-Administered Medications  Medication Dose Route Frequency Provider Last Rate Last Dose  . acetaminophen (TYLENOL) tablet 650 mg  650 mg Oral Q6H PRN Court Joyharles E Kober, PA-C      . alum & mag hydroxide-simeth (MAALOX/MYLANTA) 200-200-20 MG/5ML suspension 30 mL  30 mL Oral Q4H PRN Court Joyharles E Kober, PA-C      . ARIPiprazole (ABILIFY) tablet 10 mg  10 mg Oral QHS Mete Purdum, MD      . feeding supplement (ENSURE COMPLETE) (ENSURE COMPLETE) liquid 237 mL  237 mL Oral  TID BM Normand SloopHaley H Lawrence, RD   237 mL at 07/27/14 1000  . FLUoxetine (PROZAC) capsule 40 mg  40 mg Oral Daily Jomarie LongsSaramma Suetta Hoffmeister, MD   40 mg at 07/27/14 0823  . magnesium hydroxide (MILK OF MAGNESIA) suspension 30 mL  30 mL Oral Daily PRN Court Joyharles E Kober, PA-C      . traZODone (DESYREL) tablet 50 mg  50 mg Oral  QHS PRN,MR X 1 Court Joyharles E Kober, PA-C   50 mg at 07/26/14 2106    Lab Results:  Results for orders placed during the hospital encounter of 07/23/14 (from the past 48 hour(s))  CK TOTAL AND CKMB     Status: Abnormal   Collection Time    07/27/14  6:25 AM      Result Value Ref Range   Total CK 502 (*) 7 - 232 U/L   CK, MB 3.9  0.3 - 4.0 ng/mL   Relative Index 0.8  0.0 - 2.5   Comment: Performed at [facility]    Physical Findings: AIMS:  , ,  ,  ,    CIWA:    COWS:     Treatment Plan Summary: Daily contact with patient to assess and evaluate symptoms and progress in treatment Medication management  Plan:  1. Continue crisis management and stabilization.  2. Medication management:  -Continue Prozac  40 mg daily for depression. -Increase Abilify to 10 mg daily for psychosis.CPK level downtrending. -Trazodone 50 mg po prn for sleep.  3. Encouraged patient to attend groups and participate in group counseling sessions and activities.  4. Discharge plan in progress. CSW will work on disposition.Possible discharge tomorrow . 5. Continue current treatment plan.  6. Address health issues: Vitals reviewed and stable.   Medical Decision Making Problem Points:  Established problem, stable/improving (1), Review of last therapy session (1) and Review of psycho-social stressors (1) Data Points:  Review or order clinical lab tests (1) Review of medication regiment & side effects (2)  I certify that inpatient services furnished can reasonably be expected to improve the patient's condition.    Gwynn Crossley MD 07/27/2014 2:58 PM

## 2014-07-27 NOTE — [facility] Group Notes (Signed)
[facility] LCSW Group Therapy  07/27/2014 11:12 AM  Type of Therapy:  Group Therapy  Participation Level: Invited-- Did Not Attend  Smart, Davey Limas LCSWA 07/27/2014, 11:12 AM

## 2014-07-27 NOTE — [facility] Suicide Risk Assessment (Signed)
[facility] INPATIENT:  Family/Significant Other Suicide Prevention Education  Suicide Prevention Education:  Education Completed; Patrick Wong (pt's girlfriend) [phone] has been identified by the patient as the family member/significant other with whom the patient will be residing, and identified as the person(s) who will aid the patient in the event of a mental health crisis (suicidal ideations/suicide attempt).  With written consent from the patient, the family member/significant other has been provided the following suicide prevention education, prior to the and/or following the discharge of the patient.  The suicide prevention education provided includes the following:  Suicide risk factors  Suicide prevention and interventions  National Suicide Hotline telephone number  [facility] assessment telephone number  [facility] Emergency Assistance 911  [facility] and/or Residential Mobile Crisis Unit telephone number  Request made of family/significant other to:  Remove weapons (e.g., guns, rifles, knives), all items previously/currently identified as safety concern.    Remove drugs/medications (over-the-counter, prescriptions, illicit drugs), all items previously/currently identified as a safety concern.  The family member/significant other verbalizes understanding of the suicide prevention education information provided.  The family member/significant other agrees to remove the items of safety concern listed above.  Patrick Wong, Patrick Wong LCSWA 07/27/2014, 2:49 PM

## 2014-07-27 NOTE — Progress Notes (Signed)
D: Patient in his room on approch. Patient states he had a good day.  Patient states he thought he was supposed to go home today.  Patient states ,"I guess I wasn't ready yet."  Patient does not appear to have insight.  Patient states his goal for today was to leave but that did not happen.  Patient denies SI/HI and denies AVH. A: Staff to monitor Q 15 mins for safety.  Encouragement and support offered.  Scheduled medications administered per orders. R: Patient remains safe on the unit.  Patient attended group tonight but came out early.  Patient taking administered medications.  Patient visible on the unit minimally.

## 2014-07-27 NOTE — Progress Notes (Signed)
Adult Psychoeducational Group Note  Date:  07/27/2014 Time:  9:41 PM  Group Topic/Focus:  Wrap-Up Group:   The focus of this group is to help patients review their daily goal of treatment and discuss progress on daily workbooks.  Participation Level:  Minimal  Participation Quality:  Appropriate  Affect:  Flat  Cognitive:  Oriented  Insight: Limited  Engagement in Group:  Limited  Modes of Intervention:  Socialization and Support  Additional Comments:  Patient attended and participated in group tonight. He reports having a good day. He slept most of the day. He tid not attended his groups but did went for his meals.  Lita MainsFrancis, Kael Keetch [facility] 07/27/2014, 9:41 PM

## 2014-07-28 DIAGNOSIS — F1220 Cannabis dependence, uncomplicated: Secondary | ICD-10-CM

## 2014-07-28 DIAGNOSIS — F209 Schizophrenia, unspecified: Principal | ICD-10-CM

## 2014-07-28 MED ORDER — ARIPIPRAZOLE 10 MG PO TABS: 10.0000 mg | ORAL_TABLET | Freq: Every day | ORAL | Status: AC

## 2014-07-28 MED ORDER — FLUOXETINE HCL 40 MG PO CAPS: 40.0000 mg | ORAL_CAPSULE | Freq: Every day | ORAL | Status: AC

## 2014-07-28 MED ORDER — TRAZODONE HCL 50 MG PO TABS: 50.0000 mg | ORAL_TABLET | Freq: Every evening | ORAL | Status: AC | PRN

## 2014-07-28 NOTE — Progress Notes (Signed)
[facility] :  Will you be returning to the same living situation after discharge: Yes,  home with gf At discharge, do you have transportation home?:Yes,  bus pass in chart Do you have the ability to pay for your medications:Yes,  mental health  Release of information consent forms completed and submitted to medical records by CSW.  Patient to Follow up at: Follow-up Information   Follow up with [facility]. (Walk in between 8am-9am Monday through Friday for hospital follow-up/medication management/assessment for therapy services. )    Contact information:   [address] [place], [place] [address] Phone: [phone] Fax: [phone]      Patient denies SI/HI:   Yes,  during group/self report.    Safety Planning and Suicide Prevention discussed:  Yes,  SPE completed with pt's girlfriend. SPI pamphlet provided to pt and he was encouraged to share information with support network, ask questions, and talk about any concerns relating to SPE.  Smart, Ardell Makarewicz LCSWA 07/28/2014, 9:37 AM

## 2014-07-28 NOTE — Discharge Instructions (Signed)
Depression °Depression refers to feeling sad, low, down in the dumps, blue, gloomy, or empty. In general, there are two kinds of depression: °1. Normal sadness or normal grief. This kind of depression is one that we all feel from time to time after upsetting life experiences, such as the loss of a job or the ending of a relationship. This kind of depression is considered normal, is short lived, and resolves within a few days to 2 weeks. Depression experienced after the loss of a loved one (bereavement) often lasts longer than 2 weeks but normally gets better with time. °2. Clinical depression. This kind of depression lasts longer than normal sadness or normal grief or interferes with your ability to function at home, at work, and in school. It also interferes with your personal relationships. It affects almost every aspect of your life. Clinical depression is an illness. °Symptoms of depression can also be caused by conditions other than those mentioned above, such as: °· Physical illness. Some physical illnesses, including underactive thyroid gland (hypothyroidism), severe anemia, specific types of cancer, diabetes, uncontrolled seizures, heart and lung problems, strokes, and chronic pain are commonly associated with symptoms of depression. °· Side effects of some prescription medicine. In some people, certain types of medicine can cause symptoms of depression. °· Substance abuse. Abuse of alcohol and illicit drugs can cause symptoms of depression. °SYMPTOMS °Symptoms of normal sadness and normal grief include the following: °· Feeling sad or crying for short periods of time. °· Not caring about anything (apathy). °· Difficulty sleeping or sleeping too much. °· No longer able to enjoy the things you used to enjoy. °· Desire to be by oneself all the time (social isolation). °· Lack of energy or motivation. °· Difficulty concentrating or remembering. °· Change in appetite or weight. °· Restlessness or  agitation. °Symptoms of clinical depression include the same symptoms of normal sadness or normal grief and also the following symptoms: °· Feeling sad or crying all the time. °· Feelings of guilt or worthlessness. °· Feelings of hopelessness or helplessness. °· Thoughts of suicide or the desire to harm yourself (suicidal ideation). °· Loss of touch with reality (psychotic symptoms). Seeing or hearing things that are not real (hallucinations) or having false beliefs about your life or the people around you (delusions and paranoia). °DIAGNOSIS  °The diagnosis of clinical depression is usually based on how bad the symptoms are and how long they have lasted. Your health care provider will also ask you questions about your medical history and substance use to find out if physical illness, use of prescription medicine, or substance abuse is causing your depression. Your health care provider may also order blood tests. °TREATMENT  °Often, normal sadness and normal grief do not require treatment. However, sometimes antidepressant medicine is given for bereavement to ease the depressive symptoms until they resolve. °The treatment for clinical depression depends on how bad the symptoms are but often includes antidepressant medicine, counseling with a mental health professional, or both. Your health care provider will help to determine what treatment is best for you. °Depression caused by physical illness usually goes away with appropriate medical treatment of the illness. If prescription medicine is causing depression, talk with your health care provider about stopping the medicine, decreasing the dose, or changing to another medicine. °Depression caused by the abuse of alcohol or illicit drugs goes away when you stop using these substances. Some adults need professional help in order to stop drinking or using drugs. °SEEK IMMEDIATE MEDICAL   CARE IF:  You have thoughts about hurting yourself or others.  You lose touch  with reality (have psychotic symptoms).  You are taking medicine for depression and have a serious side effect. FOR MORE INFORMATION  National Alliance on Mental Illness: www.nami.[employer]org  National Institute of Mental Health: http://www.maynard.net/www.nimh.nih.gov Document Released: 09/21/2000 Document Revised: 02/08/2014 Document Reviewed: 12/24/2011 [facility] Patient Information 2015 [place], [place]. This information is not intended to replace advice given to you by your health care provider. Make sure you discuss any questions you have with your health care provider.  Schizophrenia Schizophrenia is a mental illness. It may cause disturbed or disorganized thinking, speech, or behavior. People with schizophrenia have problems functioning in one or more areas of life: work, school, home, or relationships. People with schizophrenia are at increased risk for suicide, certain chronic physical illnesses, and unhealthy behaviors, such as smoking and drug use. People who have family members with schizophrenia are at higher risk of developing the illness. Schizophrenia affects men and women equally but usually appears at an earlier age (teenage or early adult years) in men.  SYMPTOMS The earliest symptoms are often subtle (prodrome) and may go unnoticed until the illness becomes more severe (first-break psychosis). Symptoms of schizophrenia may be continuous or may come and go in severity. Episodes often are triggered by major life events, such as family stress, college, [employer] service, marriage, pregnancy or child birth, divorce, or loss of a loved one. People with schizophrenia may see, hear, or feel things that do not exist (hallucinations). They may have false beliefs in spite of obvious proof to the contrary (delusions). Sometimes speech is incoherent or behavior is odd or withdrawn.  DIAGNOSIS Schizophrenia is diagnosed through an assessment by your caregiver. Your caregiver will ask questions about your thoughts,  behavior, mood, and ability to function in daily life. Your caregiver may ask questions about your medical history and use of alcohol or drugs, including prescription medication. Your caregiver may also order blood tests and imaging exams. Certain medical conditions and substances can cause symptoms that resemble schizophrenia. Your caregiver may refer you to a mental health specialist for evaluation. There are three major criterion for a diagnosis of schizophrenia:  Two or more of the following five symptoms are present for a month or longer:  Delusions. Often the delusions are that you are being attacked, harassed, cheated, persecuted or conspired against (persecutory delusions).  Hallucinations.   Disorganized speech that does not make sense to others.  Grossly disorganized (confused or unfocused) behavior or extremely overactive or underactive motor activity (catatonia).  Negative symptoms such as bland or blunted emotions (flat affect), loss of will power (avolition), and withdrawal from social contacts (social isolation).  Level of functioning in one or more major areas of life (work, school, relationships, or self-care) is markedly below the level of functioning before the onset of illness.   There are continuous signs of illness (either mild symptoms or decreased level of functioning) for at least 6 months or longer. TREATMENT  Schizophrenia is a long-term illness. It is best controlled with continuous treatment rather than treatment only when symptoms occur. The following treatments are used to manage schizophrenia:  Medication--Medication is the most effective and important form of treatment for schizophrenia. Antipsychotic medications are usually prescribed to help manage schizophrenia. Other types of medication may be added to relieve any symptoms that may occur despite the use of antipsychotic medications.  Counseling or talk therapy--Individual, group, or family counseling may  be helpful in providing  education, support, and guidance. Many people with schizophrenia also benefit from social skills and job skills (vocational) training. A combination of medication and counseling is best for managing the disorder over time. A procedure in which electricity is applied to the brain through the scalp (electroconvulsive therapy) may be used to treat catatonic schizophrenia or schizophrenia in people who cannot take or do not respond to medication and counseling. Document Released: 09/21/2000 Document Revised: 05/27/2013 Document Reviewed: 12/17/2012 [facility]Exit[facility] Patient Information 2015 [place]ExitCare, MarylandLLC. This information is not intended to replace advice given to you by your health care provider. Make sure you discuss any questions you have with your health care provider.

## 2014-07-28 NOTE — Progress Notes (Signed)
Discharge Note: Discharge instructions/prescriptions/medication samples given to patient. Patient verbalized understanding of discharge instructions and prescriptions. Returned belongings to patient. Denies SI/HI/AVH. Patient d/c without incident to the lobby and transported home by his grandmother.

## 2014-07-28 NOTE — Tx Team (Addendum)
Interdisciplinary Treatment Plan Update (Adult)   Date: 07/28/2014   Time Reviewed: 9:41 AM  Progress in Treatment:  Attending groups: Intermittently  Participating in groups:  Minimally, when he attends  Taking medication as prescribed: Yes  Tolerating medication: Yes  Family/Significant othe contact made: SPE completed with pt's gf.  Patient understands diagnosis: Yes, AEB seeking treatment for AVH, SI with recent overdose, depression/mood stabilization, substance abuse (alcohol/marijuana), and for medication stabilization.  Discussing patient identified problems/goals with staff: Yes  Medical problems stabilized or resolved: Yes  Denies suicidal/homicidal ideation: Yes during self report with MD.  Patient has not harmed self or Others: Yes  New problem(s) identified:  Discharge Plan or Barriers: Pt not currently attending group. CSW assessing for appropriate referrals at this time.  Additional comments:n/a  Reason for Continuation of Hospitalization: none  Estimated length of stay: d/c today  For review of initial/current patient goals, please see plan of care.  Attendees:  Patient:    Family:    Physician: Dr. Elna BreslowEappen MD 07/28/2014. 9:41 AM   Nursing: Gay FillerChrista, Britney, Ronecia RN  07/28/2014 9:41 AM   Clinical Social Worker Brenen Beigel Smart, LCSWA  07/28/2014 9:41 AM   Other: Santa GeneraAnne Cunningham, LCSW 07/28/2014 9:41 AM   Other: Daryel Geraldodney North, LCSW 07/28/2014 9:41 AM   Other: Tomasita Morrowelora Sutton, Community Care Coordinator  07/28/2014 9:41 AM   Other: Charleston Ropesandace Hyatt, CSW Intern  07/28/2014 9:42 AM   Scribe for Treatment Team:  Trula SladeHeather Smart LCSWA 07/28/2014 9:41 AM   Pt and CSW reviewed pt's identified goals and treatment plan. Pt verbalized understanding and agreed to treatment plan.

## 2014-07-28 NOTE — [facility] Suicide Risk Assessment (Addendum)
   Demographic Factors:  Male, Low socioeconomic status and Unemployed  Total Time spent with patient: 45 minutes  Psychiatric Specialty Exam: Physical Exam   ROS  Blood pressure 132/82, pulse 109, temperature 97.9 F (36.6 C), temperature source Oral, resp. rate 20, height 6\' 3"  (1.905 m), weight 58.06 kg (128 lb).Body mass index is 16 kg/(m^2).  General Appearance: Casual  Eye Contact::  Fair  Speech:  Clear and Coherent  Volume:  Normal  Mood:  Euthymic  Affect:  Appropriate  Thought Process:  Coherent  Orientation:  Full (Time, Place, and Person)  Thought Content:  WDL  Suicidal Thoughts:  No  Homicidal Thoughts:  No  Memory:  Immediate;   Fair Recent;   Fair Remote;   Fair  Judgement:  Fair  Insight:  Fair  Psychomotor Activity:  Normal  Concentration:  Fair  Recall:  [employer]Fair  Fund of Knowledge:Fair  Language: Good  Akathisia:  No    AIMS (if indicated):     Assets:  Communication Skills Desire for Improvement Intimacy Social Support  Sleep:  Number of Hours: 6.75    Musculoskeletal: Strength & Muscle Tone: within normal limits Gait & Station: normal Patient leans: N/A   Mental Status Per Nursing Assessment::   On Admission:     Current Mental Status by Physician: DENIES SI/HI/AH/VH.  Loss Factors: NA  Historical Factors: Family history of mental illness or substance abuse and Impulsivity  Risk Reduction Factors:   Living with another person, especially a relative and Positive social support  Continued Clinical Symptoms:  Previous Psychiatric Diagnoses and Treatments  Cognitive Features That Contribute To Risk:  Polarized thinking    Suicide Risk:  Minimal: No identifiable suicidal ideation.  Patients presenting with no risk factors but with morbid ruminations; may be classified as minimal risk based on the severity of the depressive symptoms  Discharge Diagnoses:  Primary Psychiatric Diagnosis:  Schizophrenia, multiple episodes ,currently  in acute episode (acute phase resolved)  Secondary Psychiatric Diagnosis:  Cannabis use disorder,moderate  Tobacco use disorder   Non Psychiatric Diagnosis:  OD on medications (haldol,cogentin effexor,seroquel,remeron)  Prolonged QT interval  Elevated CPK- currently downtrending  Leukocytosis (resolved)  Rhabdomyolysis (RESOLVING)  Serotonin syndrome  Anticholinergic syndrome      Past Medical History  Diagnosis Date  . Scoliosis   . Depression   . Schizophrenia     Plan Of Care/Follow-up recommendations:  Activity:  NO RESTRICTIONS  Is patient on multiple antipsychotic therapies at discharge:  No   Has Patient had three or more failed trials of antipsychotic monotherapy by history:  No  Recommended Plan for Multiple Antipsychotic Therapies: NA    Caileen Veracruz md 07/28/2014, 9:51 AM

## 2014-07-28 NOTE — Discharge Summary (Signed)
Physician Discharge Summary Note  Patient:  Patrick Wong is an 23 y.o., male MRN:  [id number] DOB:  [birth year] Patient phone:  [phone] (home)  Patient address:   [address] [place] [address],  Total Time spent with patient: 20 minutes  Date of Admission:  07/23/2014 Date of Discharge: 07/28/14  Reason for Admission:  Depression, Psychosis    Active Problems:   Depression   Schizoaffective disorder  Psychiatric Specialty Exam: Physical Exam  Psychiatric: He has a normal mood and affect. His speech is normal and behavior is normal. Judgment and thought content normal. Cognition and memory are normal.    Review of Systems  Constitutional: Negative.   HENT: Negative.   Eyes: Negative.   Respiratory: Negative.   Cardiovascular: Negative.   Gastrointestinal: Negative.   Genitourinary: Negative.   Musculoskeletal: Negative.   Skin: Negative.   Neurological: Negative.   Endo/Heme/Allergies: Negative.   Psychiatric/Behavioral: Positive for depression (Stable with treatment ) and hallucinations (Stable with treatment ).    Blood pressure 132/82, pulse 109, temperature 97.9 F (36.6 C), temperature source Oral, resp. rate 20, height 6\' 3"  (1.905 m), weight 58.06 kg (128 lb).Body mass index is 16 kg/(m^2).  See Physician SRA                                                  Past Psychiatric History: See H&P Diagnosis:  Hospitalizations:  Outpatient Care:  Substance Abuse Care:  Self-Mutilation:  Suicidal Attempts:  Violent Behaviors:   Musculoskeletal: Strength & Muscle Tone: within normal limits Gait & Station: normal Patient leans: N/A  DSM5:Discharge diagnosis:  Primary Psychiatric Diagnosis:  Schizophrenia, multiple episodes ,currently in acute episode (acute phase resolved)   Secondary Psychiatric Diagnosis:  Cannabis use disorder,moderate  Tobacco use disorder   Non Psychiatric Diagnosis:  OD on medications  (haldol,cogentin effexor,seroquel,remeron)  Prolonged QT interval  Elevated CPK- currently downtrending  Leukocytosis (resolved)  Rhabdomyolysis (RESOLVING)  Serotonin syndrome  Anticholinergic syndrome   Level of Care:  OP  Hospital Course:  Patrick Wong is a 23 years old young male admitted from [facility] for increased symptoms of depression, psychosis and status post suicidal attempt with multiple psychotropic medications. Patient was treated until  medically stable in [facility] medical floor and than admitted to psychiatric unit for crisis stabilization, safety monitoring and medication management. He has a past medical history significant for depression, schizophrenia not on any medication. Patient complained feeling irritability, agitation, mood swings and hitting and punching the walls in porch at home and visual and command hallucinations and his mother took him to [place] about two weeks ago and given the diagnosis of schizophrenia. Patient stated that he has thought block for the incident of overdose with multiple medications. He has no previous suicidal attempt and psych hospitalization. He has been smoking tobacco,1 PPD, occasional cannabis and 3 beers a day. He stopped working in [employer] about a year ago due to lack of transportation. Reportedly he overdosed on benztropine 1 mg, Venlafaxine ER 150 mg, Haldol 10 mg, Seroquel 200 mg, mirtazapine 15 mg and haldol 2 mg prescribed for someone named [name] (per booklet notes). It appears that these booklets were full initially and now all medications (6 week supply) are missing. Few days prior to this incident at some point he appeared more withdrawn and  was asking his girlfriend if she also is hearing "these voices". Patient reported his mother and father has unknown mental illness.         Patrick Wong was admitted to the adult unit. He was evaluated and his symptoms were identified. Medication management was discussed  and initiated. The patient was not taking any psychiatric medications prior to admission. Patient was started on Abilify 10 mg hs to address his auditory hallucinations and Prozac 40 mg daily for depression. He was oriented to the unit and encouraged to participate in unit programming. Medical problems were identified and treated appropriately. Home medication was restarted as needed.        The patient was evaluated each day by a clinical provider to ascertain the patient's response to treatment.  Improvement was noted by the patient's report of decreasing symptoms, improved sleep and appetite, affect, medication tolerance, behavior, and participation in unit programming.  Patient appeared to minimize his reasons for admission and made several requests to be discharged. He reported that he decided to take the pills to get his girlfriend's attention. The patient denied that he was trying to kill self. He was asked each day to complete a self inventory noting mood, mental status, pain, new symptoms, anxiety and concerns.         He responded well to medication and being in a therapeutic and supportive environment. Positive and appropriate behavior was noted and the patient was motivated for recovery.  The patient worked closely with the treatment team and case manager to develop a discharge plan with appropriate goals. Coping skills, problem solving as well as relaxation therapies were also part of the unit programming.         By the day of discharge he was in much improved condition than upon admission.  Symptoms were reported as significantly decreased or resolved completely. The patient denied SI/HI and voiced no AVH. He was motivated to continue taking medication with a goal of continued improvement in mental health.  Patrick Wong was discharged home with a plan to follow up as noted below. Patient left [facility] in stable condition with all belongings returned. He was provided with prescriptions and medication  samples.   Consults:  psychiatry  Significant Diagnostic Studies:  Chemistry panel, CK total, Lipid panel, CBC, Thyroid panel  Discharge Vitals:   Blood pressure 132/82, pulse 109, temperature 97.9 F (36.6 C), temperature source Oral, resp. rate 20, height 6\' 3"  (1.905 m), weight 58.06 kg (128 lb). Body mass index is 16 kg/(m^2). Lab Results:   Results for orders placed during the hospital encounter of 07/23/14 (from the past 72 hour(s))  CK TOTAL AND CKMB     Status: Abnormal   Collection Time    07/27/14  6:25 AM      Result Value Ref Range   Total CK 502 (*) 7 - 232 U/L   CK, MB 3.9  0.3 - 4.0 ng/mL   Relative Index 0.8  0.0 - 2.5   [facility]    Physical Findings: AIMS:  , ,  ,  ,    CIWA:    COWS:     Psychiatric Specialty Exam: See Psychiatric Specialty Exam and Suicide Risk Assessment completed by Attending Physician prior to discharge.  Discharge destination:  Home  Is patient on multiple antipsychotic therapies at discharge:  No   Has Patient had three or more failed trials of antipsychotic monotherapy by history:  No  Recommended  Plan for Multiple Antipsychotic Therapies: NA     Medication List       Indication   ARIPiprazole 10 MG tablet  Commonly known as:  ABILIFY  Take 1 tablet (10 mg total) by mouth at bedtime.   Indication:  Schizophrenia     FLUoxetine 40 MG capsule  Commonly known as:  PROZAC  Take 1 capsule (40 mg total) by mouth daily. For depression.   Indication:  Depression     traZODone 50 MG tablet  Commonly known as:  DESYREL  Take 1 tablet (50 mg total) by mouth at bedtime as needed and may repeat dose one time if needed for sleep.   Indication:  Trouble Sleeping       Follow-up Information   Follow up with [facility]. (Walk in between 8am-9am Monday through Friday for hospital follow-up/medication management/assessment for therapy services. )    Contact information:   [address] [place],  [place] [id number] Phone: [phone] Fax: [phone]      Follow-up recommendations:    Activity: NO RESTRICTIONS  Comments:      Take all your medications as prescribed by your mental healthcare provider.  Report any adverse effects and or reactions from your medicines to your outpatient provider promptly.  Patient is instructed and cautioned to not engage in alcohol and or illegal drug use while on prescription medicines.  In the event of worsening symptoms, patient is instructed to call the crisis hotline, 911 and or go to the nearest ED for appropriate evaluation and treatment of symptoms.  Follow-up with your primary care provider for your other medical issues, concerns and or health care needs.   Total Discharge Time:  Greater than 30 minutes.  SignedFransisca Kaufmann: DAVIS, LAURA NP-C 07/28/2014, 2:07 PM   Patient was seen face to face for psychiatric evaluation, suicide risk assessment and case discussed with treatment team and NP and made appropriate disposition plans. Reviewed the information documented and agree with the treatment plan.    Jomarie LongsSaramma Sorin Frimpong ,MD Attending Psychiatrist  [facility]

## 2014-08-02 NOTE — Progress Notes (Signed)
Patient Discharge Instructions:  After Visit Summary (AVS):   Faxed to:  08/02/14 Discharge Summary Note:   Faxed to:  08/02/14 Psychiatric Admission Assessment Note:   Faxed to:  08/02/14 Suicide Risk Assessment - Discharge Assessment:   Faxed to:  08/02/14 Faxed/Sent to the Next Level Care provider:  08/02/14 [facility] @ [phone]  Jerelene ReddenSheena E [place], 08/02/2014, 3:36 PM

## 2016-04-20 IMAGING — CR DG SHOULDER 2+V*R*
5 series · 5 of 5 positions shown · non-contrast
Comparison: 07/20/2014.

CLINICAL DATA: History dislocations.  Pain.  Initial evaluation.

EXAM:
RIGHT SHOULDER - 2+ VIEW

[t shoulder ap internal right]
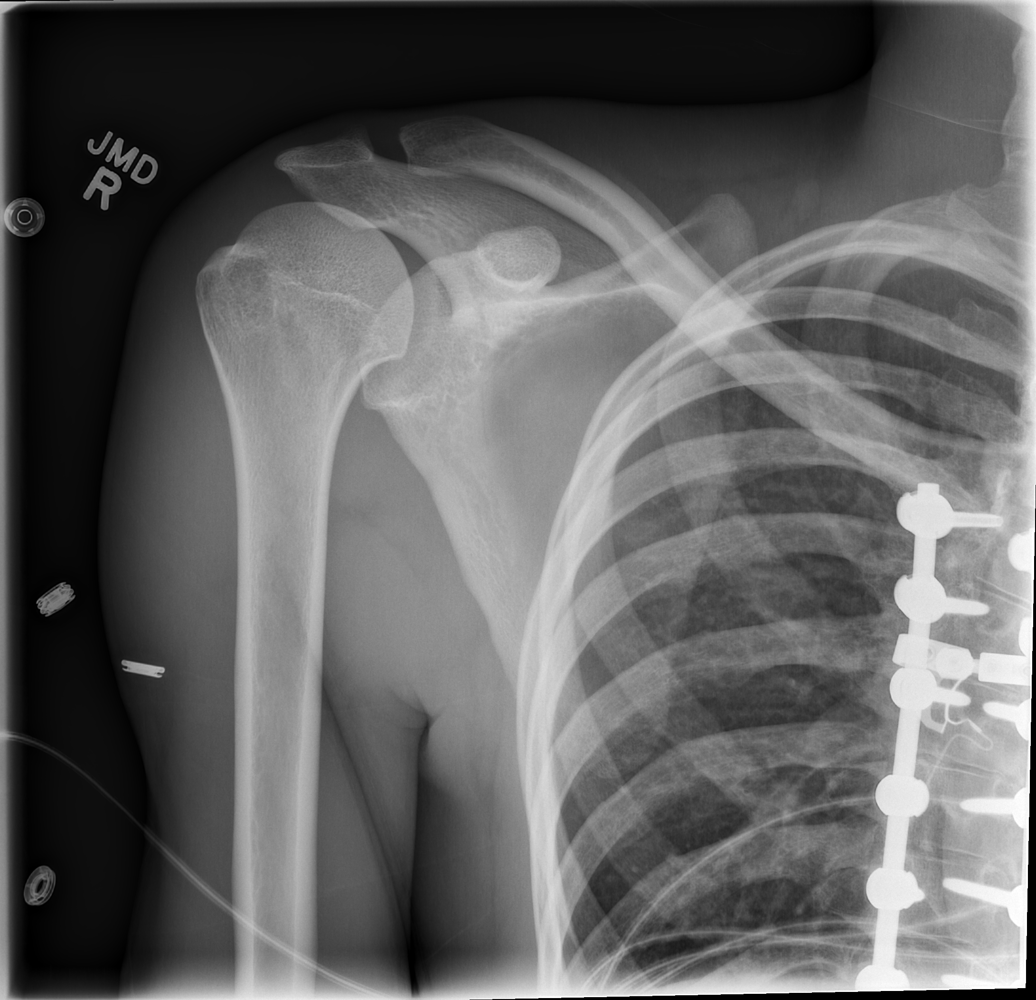

[t shoulder ap external righ (1 of 2)]
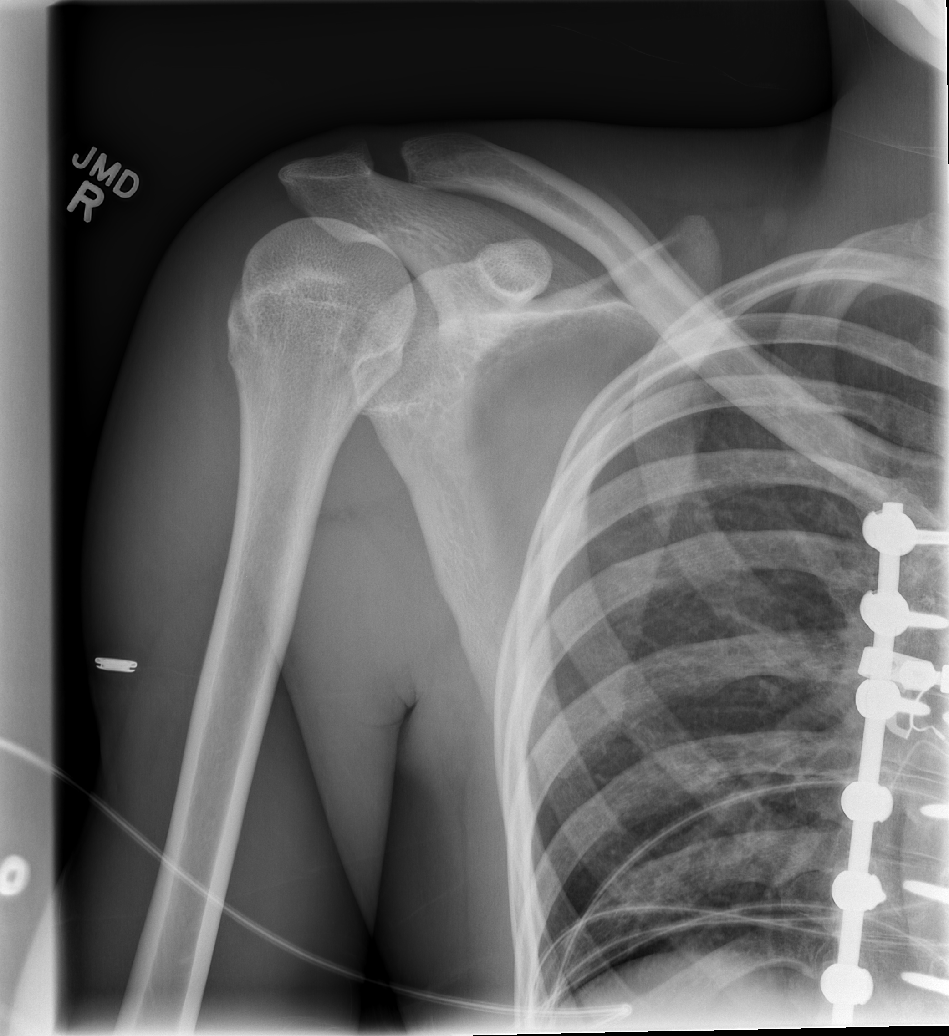

[t shoulder ap external righ (2 of 2)]
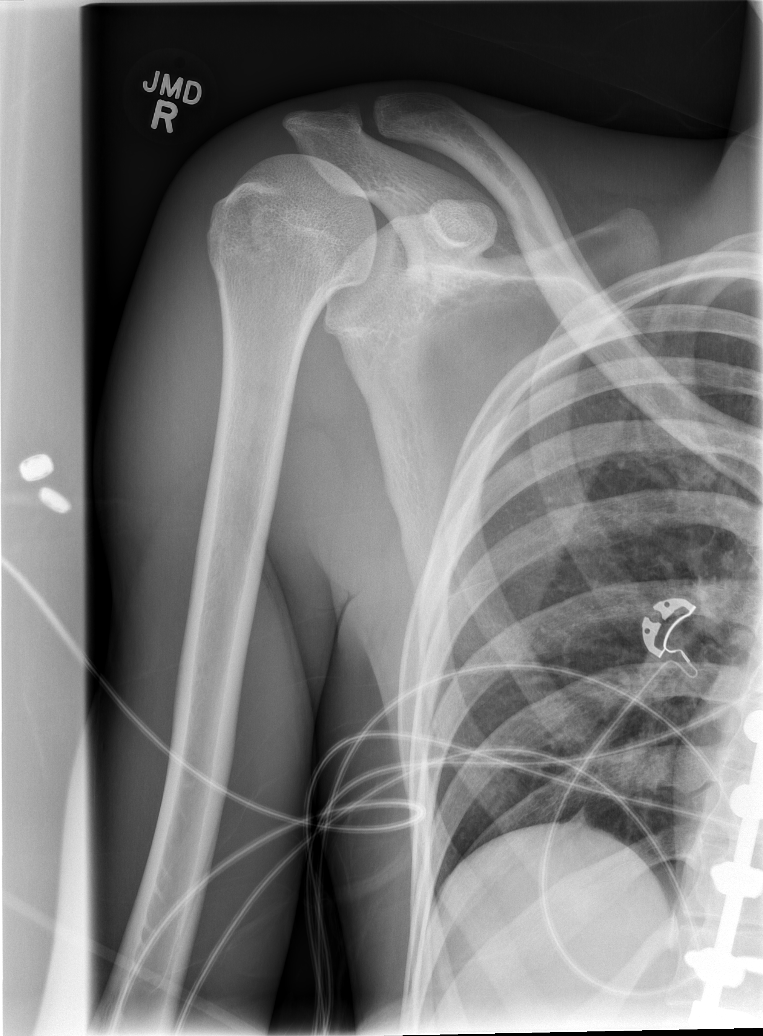

[t shoulder axillary right]
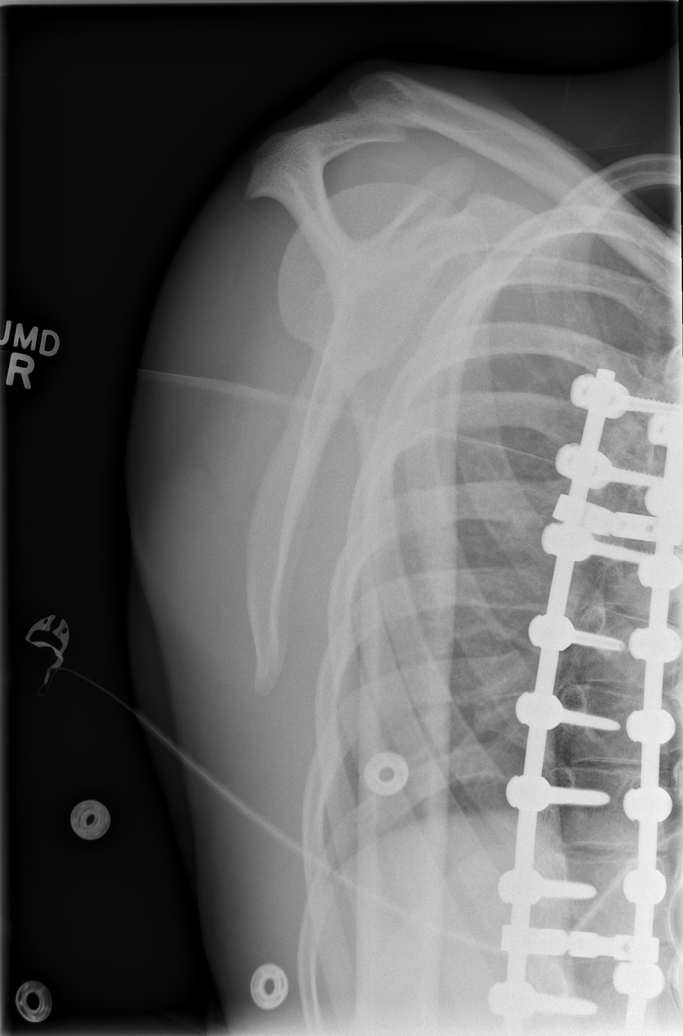

[view not recorded]
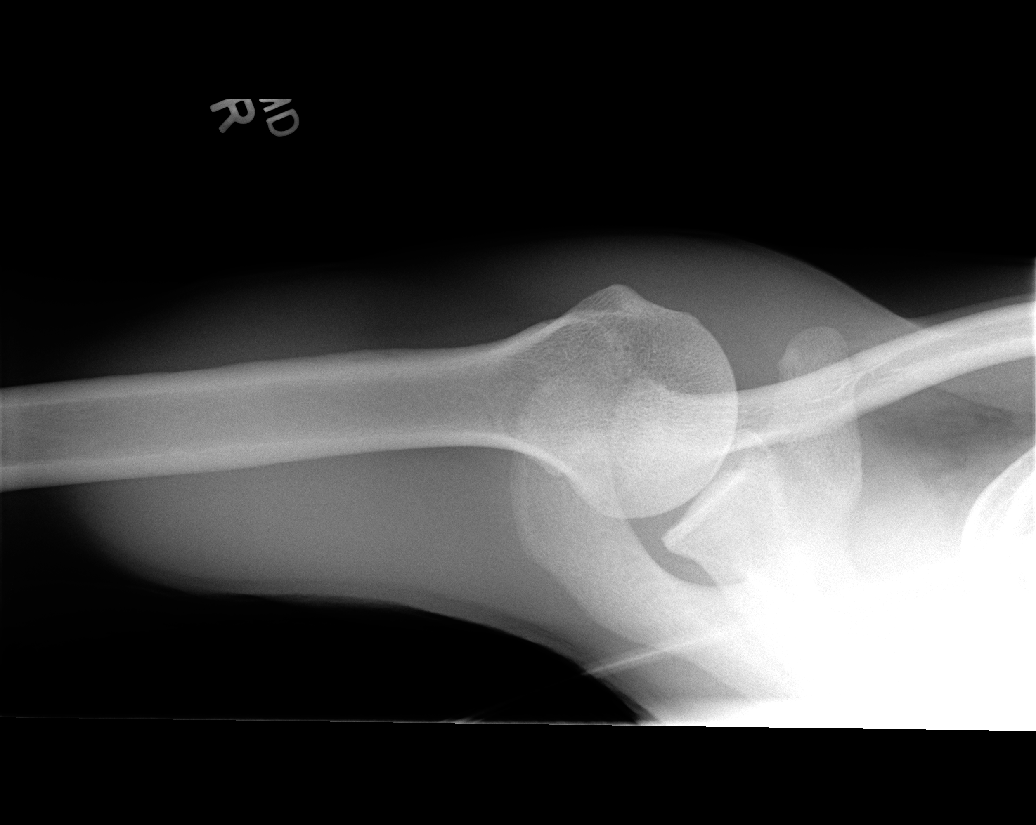

[5 of 5 positions shown; findings below may reference images not displayed]

FINDINGS: Again noted is widening of the AC joint suggesting possibility of
separation. Also again noted is mild deformity of the anterior
aspect of the glenoid. Subtle inferior glenoid fracture cannot be
excluded. No evidence of dislocation. Prior spinal fusion.
IMPRESSION: 1. Right shoulder unchanged from prior exam with possible right AC
separation. Imaging with and without weights can be obtained.

2. Subtle deformity inferior aspect of the glenoid. A subtle
fracture or degenerative change could present in this fashion. This
is unchanged.

## 2020-05-25 ENCOUNTER — Ambulatory Visit: Payer: Self-pay | Admitting: *Deleted

## 2020-05-25 ENCOUNTER — Ambulatory Visit: Payer: Self-pay | Attending: Critical Care Medicine

## 2020-05-25 DIAGNOSIS — Z23 Encounter for immunization: Secondary | ICD-10-CM

## 2020-05-26 NOTE — Progress Notes (Signed)
° °  Covid-19 Vaccination Clinic  Name:  Patrick Wong    MRN: [id number] DOB: 1991-08-18  06/14/2020  [name] was observed post Covid-19 immunization for 15 mins without incident. He was provided with Vaccine Information Sheet and instruction to access the V-Safe system.   [name] was instructed to call 911 with any severe reactions post vaccine: Difficulty breathing  Swelling of face and throat  A fast heartbeat  A bad rash all over body  Dizziness and weakness   Immunizations Administered     Name Date Dose VIS Date Route   Moderna COVID-19 Vaccine 05/25/2020  5:40 PM 0.5 mL 09/2019 Intramuscular   Manufacturer: Moderna   Lot: [id number]   NDC: 09323-557-32

## 2020-06-14 NOTE — Progress Notes (Signed)
Patient tolerated injection well.

## 2020-06-22 ENCOUNTER — Ambulatory Visit: Payer: Self-pay | Attending: Critical Care Medicine

## 2020-06-22 ENCOUNTER — Ambulatory Visit: Payer: Self-pay | Admitting: *Deleted

## 2020-06-22 DIAGNOSIS — Z23 Encounter for immunization: Secondary | ICD-10-CM

## 2020-06-22 NOTE — Progress Notes (Signed)
Patient tolerated injection well today 

## 2020-06-22 NOTE — Progress Notes (Signed)
   Covid-19 Vaccination Clinic  Name:  Patrick Wong    MRN: [id number] DOB: 1991-01-17  06/22/2020  Patrick Wong was observed post Covid-19 immunization for 15 minutes without incident. He was provided with Vaccine Information Sheet and instruction to access the V-Safe system.   Patrick Wong was instructed to call 911 with any severe reactions post vaccine: Marland Kitchen Difficulty breathing  . Swelling of face and throat  . A fast heartbeat  . A bad rash all over body  . Dizziness and weakness   Immunizations Administered    Name Date Dose VIS Date Route   Moderna COVID-19 Vaccine 06/22/2020  5:20 PM 0.5 mL 09/2019 Intramuscular   Manufacturer: Gala Murdoch   Lot: [id number]   NDC: 58832-549-82

## 2022-07-02 ENCOUNTER — Emergency Department (HOSPITAL_BASED_OUTPATIENT_CLINIC_OR_DEPARTMENT_OTHER)
Admission: EM | Admit: 2022-07-02 | Discharge: 2022-07-02 | Disposition: A | Discharge status: Home / Self Care | Payer: Self-pay | Attending: Emergency Medicine | Admitting: Emergency Medicine

## 2022-07-02 ENCOUNTER — Encounter (HOSPITAL_BASED_OUTPATIENT_CLINIC_OR_DEPARTMENT_OTHER): Payer: Self-pay | Admitting: Emergency Medicine

## 2022-07-02 ENCOUNTER — Other Ambulatory Visit: Payer: Self-pay

## 2022-07-02 ENCOUNTER — Emergency Department (HOSPITAL_BASED_OUTPATIENT_CLINIC_OR_DEPARTMENT_OTHER): Payer: Self-pay

## 2022-07-02 DIAGNOSIS — R319 Hematuria, unspecified: Secondary | ICD-10-CM

## 2022-07-02 DIAGNOSIS — R8289 Other abnormal findings on cytological and histological examination of urine: Secondary | ICD-10-CM | POA: Insufficient documentation

## 2022-07-02 LAB — CBC WITH DIFFERENTIAL/PLATELET
Abs Immature Granulocytes: 0.03 10*3/uL (ref 0.00–0.07)
Basophils Absolute: 0.1 10*3/uL (ref 0.0–0.1)
Basophils Relative: 1 %
Eosinophils Absolute: 0.2 10*3/uL (ref 0.0–0.5)
Eosinophils Relative: 3 %
HCT: 42.1 % (ref 39.0–52.0)
Hemoglobin: 14 g/dL (ref 13.0–17.0)
Immature Granulocytes: 0 %
Lymphocytes Relative: 30 %
Lymphs Abs: 2.3 10*3/uL (ref 0.7–4.0)
MCH: 28.6 pg (ref 26.0–34.0)
MCHC: 33.3 g/dL (ref 30.0–36.0)
MCV: 86.1 fL (ref 80.0–100.0)
Monocytes Absolute: 0.7 10*3/uL (ref 0.1–1.0)
Monocytes Relative: 9 %
Neutro Abs: 4.3 10*3/uL (ref 1.7–7.7)
Neutrophils Relative %: 57 %
Platelets: 253 10*3/uL (ref 150–400)
RBC: 4.89 MIL/uL (ref 4.22–5.81)
RDW: 15 % (ref 11.5–15.5)
WBC: 7.6 10*3/uL (ref 4.0–10.5)
nRBC: 0 % (ref 0.0–0.2)

## 2022-07-02 LAB — URINALYSIS, ROUTINE W REFLEX MICROSCOPIC
Bilirubin Urine: NEGATIVE
Glucose, UA: NEGATIVE mg/dL
Ketones, ur: NEGATIVE mg/dL
Leukocytes,Ua: NEGATIVE
Nitrite: NEGATIVE
Protein, ur: NEGATIVE mg/dL
RBC / HPF: >50 RBC/hpf — ABNORMAL HIGH (ref 0–5)
Specific Gravity, Urine: 1.017 (ref 1.005–1.030)
pH: 7 (ref 5.0–8.0)

## 2022-07-02 LAB — BASIC METABOLIC PANEL
Anion gap: 5 (ref 5–15)
BUN: 14 mg/dL (ref 6–20)
CO2: 25 mmol/L (ref 22–32)
Calcium: 9.1 mg/dL (ref 8.9–10.3)
Chloride: 105 mmol/L (ref 98–111)
Creatinine, Ser: 0.94 mg/dL (ref 0.61–1.24)
GFR, Estimated: >60 mL/min (ref >60)
Glucose, Bld: 84 mg/dL (ref 70–99)
Potassium: 4.1 mmol/L (ref 3.5–5.1)
Sodium: 135 mmol/L (ref 135–145)

## 2022-07-02 NOTE — Discharge Instructions (Signed)
You could have passed a small kidney stone.  I also have a culture of your urine pending and you will be called by phone if you need any antibiotics for urinary tract infection and treatment of an STD.  If you continue having blood in your urine you should call to schedule follow-up appointment with the urologist.

## 2022-07-02 NOTE — ED Notes (Signed)
RN provided AVS using Teachback Method. Patient verbalizes understanding of Discharge Instructions. Opportunity for Questioning and Answers were provided by RN. Patient Discharged from ED ambulatory to Home. ° °

## 2022-07-02 NOTE — ED Provider Notes (Signed)
[place] [facility] DEPT Provider Note   CSN: [id number] Arrival date & time: 07/02/22  1454     History {Add pertinent medical, surgical, social history, OB history to HPI:1} Chief Complaint  Patient presents with   Hematuria    Patrick Wong is a 31 y.o. male.   Hematuria       Home Medications Prior to Admission medications   Medication Sig Start Date End Date Taking? Authorizing Provider  ARIPiprazole (ABILIFY) 10 MG tablet Take 1 tablet (10 mg total) by mouth at bedtime. 07/28/14   Niel Hummer, NP  FLUoxetine (PROZAC) 40 MG capsule Take 1 capsule (40 mg total) by mouth daily. For depression. 07/28/14   Niel Hummer, NP  traZODone (DESYREL) 50 MG tablet Take 1 tablet (50 mg total) by mouth at bedtime as needed and may repeat dose one time if needed for sleep. 07/28/14   Niel Hummer, NP      Allergies    Patient has no known allergies.    Review of Systems   Review of Systems  Genitourinary:  Positive for hematuria.    Physical Exam Updated Vital Signs BP 127/86 (BP Location: Right Arm)   Pulse (!) 52   Temp 98.2 F (36.8 C)   Resp 12   Ht 6\' 4"  (1.93 m)   Wt 68 kg   SpO2 99%   BMI 18.26 kg/m  Physical Exam  ED Results / Procedures / Treatments   Labs (all labs ordered are listed, but only abnormal results are displayed) Labs Reviewed  URINALYSIS, ROUTINE W REFLEX MICROSCOPIC - Abnormal; Notable for the following components:      Result Value   Hgb urine dipstick LARGE (*)    RBC / HPF >50 (*)    All other components within normal limits  URINE CULTURE  BASIC METABOLIC PANEL  CBC WITH DIFFERENTIAL/PLATELET  GC/CHLAMYDIA PROBE AMP ([place]) NOT AT [facility]    EKG None  Radiology CT Renal Stone Study  Result Date: 07/02/2022 CLINICAL DATA:  Blood in urine EXAM: CT ABDOMEN AND PELVIS WITHOUT CONTRAST TECHNIQUE: Multidetector CT imaging of the abdomen and pelvis was performed following the standard protocol without IV  contrast. RADIATION DOSE REDUCTION: This exam was performed according to the departmental dose-optimization program which includes automated exposure control, adjustment of the mA and/or kV according to patient size and/or use of iterative reconstruction technique. COMPARISON:  None Available. FINDINGS: Lower chest: Lung bases are clear Hepatobiliary: No focal liver abnormality is seen. No gallstones, gallbladder wall thickening, or biliary dilatation. Pancreas: Unremarkable. No pancreatic ductal dilatation or surrounding inflammatory changes. Spleen: Normal in size without focal abnormality. Adrenals/Urinary Tract: Adrenal glands are normal. No hydronephrosis. Punctate nonobstructing right kidney stone. The bladder demonstrates possible wall thickening posteriorly versus small amount of slightly dense dependent material. Stomach/Bowel: Stomach is within normal limits. Appendix appears normal. No evidence of bowel wall thickening, distention, or inflammatory changes. Short segment small bowel intussusception slightly to the left of midline in the upper pelvis, series 2 image 44 to 54. No obstructive features. Vascular/Lymphatic: Nonaneurysmal aorta.  No suspicious lymph nodes Reproductive: The prostate appears enlarged. Other: Negative for pelvic effusion or free air. Small fat containing umbilical hernia Musculoskeletal: Scoliosis. Partially visualized posterior spinal rods IMPRESSION: 1. Negative for hydronephrosis or ureteral stone. Mild posterior bladder wall thickening versus small amount of slightly dense dependent material (?hemorrhagic) within the bladder. Nonobstructing right kidney stone. 2. Short segment small bowel intussusception in the upper pelvis slightly  to the left of midline without evidence for bowel obstruction. 3. The prostate appears enlarged. Electronically Signed   By: Jasmine Pang M.D.   On: 07/02/2022 18:51    Procedures Procedures  {Document cardiac monitor, telemetry assessment  procedure when appropriate:1}  Medications Ordered in ED Medications - No data to display  ED Course/ Medical Decision Making/ A&P                           Medical Decision Making Amount and/or Complexity of Data Reviewed Labs: ordered. Radiology: ordered.   ***  {Document critical care time when appropriate:1} {Document review of labs and clinical decision tools ie heart score, Chads2Vasc2 etc:1}  {Document your independent review of radiology images, and any outside records:1} {Document your discussion with family members, caretakers, and with consultants:1} {Document social determinants of health affecting pt's care:1} {Document your decision making why or why not admission, treatments were needed:1} Final Clinical Impression(s) / ED Diagnoses Final diagnoses:  Hematuria, unspecified type    Rx / DC Orders ED Discharge Orders     None

## 2022-07-02 NOTE — ED Triage Notes (Signed)
Blood in urine  Notice today denies pain or other urinary symptoms

## 2022-07-02 NOTE — ED Triage Notes (Signed)
Pt BIBA from home. Pt states he noticed blood in urine this AM. Pt states he had pain in his bladder over the weekend.

## 2022-07-03 LAB — GC/CHLAMYDIA PROBE AMP (~~LOC~~) NOT AT ARMC
Chlamydia: NEGATIVE
Comment: NEGATIVE
Comment: NORMAL
Neisseria Gonorrhea: NEGATIVE

## 2022-07-03 LAB — URINE CULTURE: Culture: <10000 — AB
